# Patient Record
Sex: Female | Born: 1945 | Race: White | Hispanic: No | Smoking: Current every day smoker
Health system: Southern US, Community
[De-identification: ages and names within clinical notes are randomized; demographics above are authoritative.]

## PROBLEM LIST (undated history)

## (undated) DIAGNOSIS — I4891 Unspecified atrial fibrillation: Secondary | ICD-10-CM

## (undated) DIAGNOSIS — J449 Chronic obstructive pulmonary disease, unspecified: Secondary | ICD-10-CM

## (undated) DIAGNOSIS — I619 Nontraumatic intracerebral hemorrhage, unspecified: Secondary | ICD-10-CM

## (undated) DIAGNOSIS — S0990XA Unspecified injury of head, initial encounter: Secondary | ICD-10-CM

## (undated) DIAGNOSIS — I10 Essential (primary) hypertension: Secondary | ICD-10-CM

## (undated) DIAGNOSIS — E039 Hypothyroidism, unspecified: Secondary | ICD-10-CM

## (undated) HISTORY — PX: PACEMAKER INSERTION: SHX728

## (undated) HISTORY — PX: BRAIN SURGERY: SHX531

---

## 2012-05-19 ENCOUNTER — Ambulatory Visit: Payer: Self-pay | Admitting: Family Medicine

## 2012-06-19 ENCOUNTER — Ambulatory Visit: Payer: Self-pay | Admitting: Gastroenterology

## 2014-06-15 ENCOUNTER — Ambulatory Visit: Admit: 2014-06-15 | Disposition: A | Discharge status: Home / Self Care | Payer: Self-pay | Admitting: Cardiology

## 2014-06-15 DIAGNOSIS — R55 Syncope and collapse: Secondary | ICD-10-CM

## 2014-06-15 LAB — BASIC METABOLIC PANEL
Anion Gap: 5 — ABNORMAL LOW (ref 7–16)
BUN: 13 mg/dL
CALCIUM: 8.4 mg/dL — AB
CO2: 30 mmol/L
Chloride: 102 mmol/L
Creatinine: 0.75 mg/dL
EGFR (African American): >60
EGFR (Non-African Amer.): >60
GLUCOSE: 83 mg/dL
POTASSIUM: 4.1 mmol/L
Sodium: 137 mmol/L

## 2014-06-15 LAB — APTT: Activated PTT: 37.5 secs — ABNORMAL HIGH (ref 23.6–35.9)

## 2014-06-15 LAB — CBC WITH DIFFERENTIAL/PLATELET
Basophil #: 0.1 10*3/uL (ref 0.0–0.1)
Basophil %: 1.2 %
EOS ABS: 0.2 10*3/uL (ref 0.0–0.7)
Eosinophil %: 2 %
HCT: 38.7 % (ref 35.0–47.0)
HGB: 12.9 g/dL (ref 12.0–16.0)
LYMPHS PCT: 13.8 %
Lymphocyte #: 1.1 10*3/uL (ref 1.0–3.6)
MCH: 30.8 pg (ref 26.0–34.0)
MCHC: 33.4 g/dL (ref 32.0–36.0)
MCV: 92 fL (ref 80–100)
MONO ABS: 0.7 x10 3/mm (ref 0.2–0.9)
Monocyte %: 8.9 %
NEUTROS ABS: 5.8 10*3/uL (ref 1.4–6.5)
Neutrophil %: 74.1 %
Platelet: 241 10*3/uL (ref 150–440)
RBC: 4.2 10*6/uL (ref 3.80–5.20)
RDW: 14.7 % — ABNORMAL HIGH (ref 11.5–14.5)
WBC: 7.9 10*3/uL (ref 3.6–11.0)

## 2014-06-15 LAB — PROTIME-INR
INR: 1.9
Prothrombin Time: 21.9 secs — ABNORMAL HIGH

## 2014-06-15 LAB — DIGOXIN LEVEL: DIGOXIN: 0.8 ng/mL

## 2014-06-17 ENCOUNTER — Ambulatory Visit
Admit: 2014-06-17 | Disposition: A | Discharge status: Home / Self Care | Payer: Self-pay | Attending: Cardiology | Admitting: Cardiology

## 2014-06-17 LAB — PROTIME-INR
INR: 1.2
PROTHROMBIN TIME: 15.2 s — AB

## 2014-06-27 NOTE — Op Note (Signed)
PATIENT NAME:  Alicia Reynolds, Alicia Reynolds MR#:  [id number] DATE OF BIRTH:  May 21, 1945  DATE OF PROCEDURE:  06/17/2014  PRIMARY CARE PHYSICIAN:  Rhona LeavensJames F. Burnett ShengHedrick, M.D.   PROCEDURE:  Biventricular implantable cardiac defibrillator change out.   INDICATION: The patient is a 69 year old female with history of coronary artery disease, chronic systolic congestive heart failure who is status post BIVICD in 2009. Recent interrogation has shown the BIVICD was at elective replacement indication. The risks, benefits, and alternatives of BIVICD change out were explained to the patient and informed written consent was obtained.   DESCRIPTION OF PROCEDURE: She was brought to the operating room in a fasting state. The left pectoral region was prepped and draped in the usual sterile manner. Anesthesia was obtained with 1% Xylocaine locally. A 6 cm incision was performed over the old ICD site. The old BIVICD was achieved by electrocautery and blunt dissection. The leads were disconnected and connected to a new BIVICD (Medtronic [id number]).  The pocket was irrigated with gentamicin solution. The new ICD generator was positioned in the pocket. The pocket was closed with 2-0 and 4-0 Vicryl, respectively. Steri-Strips and pressure dressing were applied.   ____________________________ Marcina MillardAlexander Jahon Bart, MD ap:sp D: 06/17/2014 13:54:45 ET T: 06/17/2014 14:59:36 ET JOB#: [id number]  cc: Marcina MillardAlexander Tupac Jeffus, MD, <Dictator> Marcina MillardALEXANDER Shloime Keilman MD ELECTRONICALLY SIGNED 06/22/2014 17:19

## 2014-10-07 ENCOUNTER — Encounter: Payer: Self-pay | Admitting: Cardiology

## 2016-06-28 ENCOUNTER — Telehealth: Payer: Self-pay | Admitting: Family

## 2016-06-28 ENCOUNTER — Ambulatory Visit: Payer: Medicare PPO | Admitting: Family

## 2016-06-28 NOTE — Telephone Encounter (Signed)
Patient missed [facility] initial [facility] on [facility]/16. Will attempt to reschedule.

## 2017-01-10 ENCOUNTER — Other Ambulatory Visit: Payer: Self-pay | Admitting: Specialist

## 2017-01-10 DIAGNOSIS — R911 Solitary pulmonary nodule: Secondary | ICD-10-CM

## 2017-04-11 ENCOUNTER — Emergency Department
Admission: EM | Admit: 2017-04-11 | Discharge: 2017-04-11 | Disposition: A | Discharge status: Other Acute Inpatient Hospital | Payer: Medicare PPO | Attending: Emergency Medicine | Admitting: Emergency Medicine

## 2017-04-11 ENCOUNTER — Emergency Department: Payer: Medicare PPO

## 2017-04-11 DIAGNOSIS — Z95810 Presence of automatic (implantable) cardiac defibrillator: Secondary | ICD-10-CM | POA: Insufficient documentation

## 2017-04-11 DIAGNOSIS — S065X9A Traumatic subdural hemorrhage with loss of consciousness of unspecified duration, initial encounter: Secondary | ICD-10-CM

## 2017-04-11 DIAGNOSIS — I6200 Nontraumatic subdural hemorrhage, unspecified: Secondary | ICD-10-CM | POA: Insufficient documentation

## 2017-04-11 DIAGNOSIS — I5032 Chronic diastolic (congestive) heart failure: Secondary | ICD-10-CM | POA: Insufficient documentation

## 2017-04-11 DIAGNOSIS — I110 Hypertensive heart disease with heart failure: Secondary | ICD-10-CM | POA: Insufficient documentation

## 2017-04-11 DIAGNOSIS — J449 Chronic obstructive pulmonary disease, unspecified: Secondary | ICD-10-CM | POA: Insufficient documentation

## 2017-04-11 DIAGNOSIS — I4891 Unspecified atrial fibrillation: Secondary | ICD-10-CM | POA: Diagnosis not present

## 2017-04-11 DIAGNOSIS — F17210 Nicotine dependence, cigarettes, uncomplicated: Secondary | ICD-10-CM | POA: Insufficient documentation

## 2017-04-11 DIAGNOSIS — Z7901 Long term (current) use of anticoagulants: Secondary | ICD-10-CM | POA: Diagnosis not present

## 2017-04-11 DIAGNOSIS — I259 Chronic ischemic heart disease, unspecified: Secondary | ICD-10-CM | POA: Diagnosis not present

## 2017-04-11 DIAGNOSIS — R4182 Altered mental status, unspecified: Secondary | ICD-10-CM | POA: Diagnosis present

## 2017-04-11 DIAGNOSIS — S065XAA Traumatic subdural hemorrhage with loss of consciousness status unknown, initial encounter: Secondary | ICD-10-CM

## 2017-04-11 LAB — BASIC METABOLIC PANEL
Anion gap: 14 (ref 5–15)
BUN: 27 mg/dL — ABNORMAL HIGH (ref 6–20)
CHLORIDE: 97 mmol/L — AB (ref 101–111)
CO2: 24 mmol/L (ref 22–32)
Calcium: 9.4 mg/dL (ref 8.9–10.3)
Creatinine, Ser: 0.91 mg/dL (ref 0.44–1.00)
GFR calc Af Amer: >60 mL/min (ref >60)
GFR calc non Af Amer: >60 mL/min (ref >60)
GLUCOSE: 125 mg/dL — AB (ref 65–99)
Potassium: 4.4 mmol/L (ref 3.5–5.1)
Sodium: 135 mmol/L (ref 135–145)

## 2017-04-11 LAB — CBC
HEMATOCRIT: 39.6 % (ref 35.0–47.0)
HEMOGLOBIN: 13 g/dL (ref 12.0–16.0)
MCH: 29.3 pg (ref 26.0–34.0)
MCHC: 32.9 g/dL (ref 32.0–36.0)
MCV: 89.1 fL (ref 80.0–100.0)
Platelets: 222 10*3/uL (ref 150–440)
RBC: 4.45 MIL/uL (ref 3.80–5.20)
RDW: 16.4 % — ABNORMAL HIGH (ref 11.5–14.5)
WBC: 12.1 10*3/uL — ABNORMAL HIGH (ref 3.6–11.0)

## 2017-04-11 LAB — BLOOD GAS, ARTERIAL
ACID-BASE EXCESS: 3.8 mmol/L — AB (ref 0.0–2.0)
Bicarbonate: 25.4 mmol/L (ref 20.0–28.0)
FIO2: 1
O2 Saturation: 100 %
PH ART: 7.55 — AB (ref 7.350–7.450)
PO2 ART: 511 mmHg — AB (ref 83.0–108.0)
Patient temperature: 37
RATE: 16 resp/min
pCO2 arterial: 29 mmHg — ABNORMAL LOW (ref 32.0–48.0)

## 2017-04-11 LAB — GLUCOSE, CAPILLARY: Glucose-Capillary: 122 mg/dL — ABNORMAL HIGH (ref 65–99)

## 2017-04-11 LAB — PROTIME-INR
INR: 3.74
Prothrombin Time: 36.7 seconds — ABNORMAL HIGH (ref 11.4–15.2)

## 2017-04-11 LAB — APTT: aPTT: 35 seconds (ref 24–36)

## 2017-04-11 LAB — TROPONIN I

## 2017-04-11 MED ORDER — ROCURONIUM BROMIDE 50 MG/5ML IV SOLN
INTRAVENOUS | Status: AC | PRN
  Administered 2017-04-11: 50 mg via INTRAVENOUS

## 2017-04-11 MED ORDER — MANNITOL 25 % IV SOLN
1.0000 g/kg | Freq: Once | INTRAVENOUS | Status: AC
  Administered 2017-04-11: 47.6 g via INTRAVENOUS
  Filled 2017-04-11: qty 190.4

## 2017-04-11 MED ORDER — PROPOFOL 1000 MG/100ML IV EMUL
INTRAVENOUS | Status: AC
  Filled 2017-04-11: qty 100

## 2017-04-11 MED ORDER — ETOMIDATE 2 MG/ML IV SOLN
INTRAVENOUS | Status: AC | PRN
  Administered 2017-04-11: 15 mg via INTRAVENOUS

## 2017-04-11 MED ORDER — PROPOFOL 10 MG/ML IV BOLUS
40.0000 mg | Freq: Once | INTRAVENOUS | Status: AC
  Administered 2017-04-11: 40 mg via INTRAVENOUS

## 2017-04-11 MED ORDER — NICARDIPINE HCL IN NACL 20-0.86 MG/200ML-% IV SOLN
3.0000 mg/h | Freq: Once | INTRAVENOUS | Status: AC
  Administered 2017-04-11: 5 mg/h via INTRAVENOUS
  Filled 2017-04-11: qty 200

## 2017-04-11 MED ORDER — PROTHROMBIN COMPLEX CONC HUMAN 500 UNITS IV KIT
1000.0000 [IU] | PACK | Status: AC
  Administered 2017-04-11: 1000 [IU] via INTRAVENOUS
  Filled 2017-04-11: qty 1000

## 2017-04-11 MED ORDER — PROPOFOL 1000 MG/100ML IV EMUL
40.0000 ug/kg/min | Freq: Once | INTRAVENOUS | Status: AC
  Administered 2017-04-11: 40 ug/kg/min via INTRAVENOUS

## 2017-04-11 MED ORDER — NALOXONE HCL 2 MG/2ML IJ SOSY
PREFILLED_SYRINGE | INTRAMUSCULAR | Status: AC
  Administered 2017-04-11: 11:00:00
  Filled 2017-04-11: qty 2

## 2017-04-11 MED ORDER — VITAMIN K1 10 MG/ML IJ SOLN
10.0000 mg | INTRAVENOUS | Status: AC
  Administered 2017-04-11: 10 mg via INTRAVENOUS
  Filled 2017-04-11: qty 1

## 2017-04-11 NOTE — ED Notes (Signed)
og tube inserted 14 fr.  Port cxr is being done now.

## 2017-04-11 NOTE — Consult Note (Signed)
Call fielded for GCS7 with large L SDH  - transfer to [facility] - reverse anticoagulation - give mannitol - SBP<160

## 2017-04-11 NOTE — ED Notes (Signed)
Consent to transfer signed by patients boyfriend, pt intubated and unable to sign. Initial consent given by patients daughter over the phone to MD Don PerkingVeronese for treatment and transfer

## 2017-04-11 NOTE — ED Notes (Signed)
Hob is at 45 degrees.  ekg done.  Rhythm is unchanged with paced and occasional pvc.

## 2017-04-11 NOTE — ED Triage Notes (Signed)
Brought by ems for altered mental status stared yesterday during day, and boyfriend told ems he has been unable to wake her since 8 or 9pm last night.  Pt responds to tactile.  Pupils were non reactive and left is larger than right.  fsbs was 120s per ems.

## 2017-04-11 NOTE — ED Notes (Signed)
Patient is only responsive to painful stimuli.  Narcan given with no response.  Preparing to intubate.

## 2017-04-11 NOTE — ED Notes (Signed)
Spoke with daughter Jennette Kettlekim nalesnik on phone to give her patient condition.  Dr Don Perkingveronese spoke with her as well.  [place] and will be coming to [place] now.  Her cell number is [phone].

## 2017-04-11 NOTE — ED Provider Notes (Addendum)
[facility] Emergency Department Provider Note  ____________________________________________  Time seen: Approximately 11:42 AM  I have reviewed the triage vital signs and the nursing notes.   HISTORY  Chief Complaint Altered Mental Status  Level 5 caveat:  Portions of the history and physical were unable to be obtained due to unresponsive   HPI Alicia Reynolds is a 72 y.o. 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 According to EMS, patient's boyfriend was the one who called 911. According to him patient has been progressively more confused throughout the day yesterday. Since yesterday evening she has been unresponsive and unable to arouse sitting on her recliner. When EMS arrived at the house she had fixed and asymmetric pupils, patient was breathing, normal vitals, unresponsive. She received a milligram of Narcan with no change in mental status. BG 122.   PMH:   Hypertension 06/25/2016  MVP (mitral valve prolapse) 06/25/2016  Overview:   MV replacement with mechanical prosthesis 2006, on chronic warfarin   COPD (chronic obstructive pulmonary disease) , unspecified 06/25/2016  Myocardial infarction 06/25/2016  Overview:   Non STEMI, coronary stent 2008   SOB (shortness of breath) on exertion 12/28/2015  Chest pain with high risk for cardiac etiology, unspecified 05/31/2014  Status post myocardial infarction 10/07/2013  Overview:   [facility]    ICD (implantable cardioverter-defibrillator) in place 10/07/2013  Overview:   [facility]   Status post catheter ablation of atrial fibrillation 10/07/2013  Overview[facility]   Diastolic congestive heart failure 10/07/2013  Overview:   chronic   Atrial fibrillation  [427.31] 06/22/2013     Prior to Admission medications   Not on File    Allergies Patient has no allergy information on record.  FH Colon cancer Brother    Colon polyps Brother    Prostate cancer Brother    Heart disease Father    Myocardial Infarction (Heart attack) Father    Stomach cancer Maternal Aunt    Cancer Maternal Aunt    Breast cancer Mother    Osteoporosis (Thinning of bones) Mother       Social History Current Some Day Smoker Cigarettes 0.25 55   Smokeless Tobacco: Never Used      Tobacco Cessation: Ready to Quit: Yes; Counseling Given: Yes Comments: 2-3 cigarrettes per day   Alcohol Use Drinks/Week oz/Week Comments  Yes 0 Standard drinks or equivalent  0.0  socially     Review of Systems  Constitutional: + unresponsive  Level 5 caveat:  Portions of the history and physical were unable to be obtained due to unresponsive  ____________________________________________   PHYSICAL EXAM:  VITAL SIGNS: ED Triage Vitals  Enc Vitals Group     BP 04/11/17 1109 (!) 164/84     Pulse Rate 04/11/17 1105 81     Resp 04/11/17 1105 (!) 28     Temp --      Temp src --      SpO2 04/11/17 1105 96 %     Weight 04/11/17 1105 105 lb (47.6 kg)     Height --      Head Circumference --      Peak Flow --      Pain Score --      Pain Loc --  Pain Edu? --      Excl. in GC? --     Constitutional: GCS 7 on arrival, breathing, no signs of trauma HEENT:      Head: Normocephalic and atraumatic.         Eyes: Conjunctivae are normal. Sclera is non-icteric. R pupil is 3 mm and L 2 mm non reactive      Mouth/Throat: Mucous membranes are moist.       Neck: Supple with no signs of meningismus. Cardiovascular: Regular rate and rhythm. No murmurs, gallops, or rubs. 2+ symmetrical distal pulses are present in all extremities. No JVD. Respiratory: Normal respiratory effort. Lungs are clear to auscultation bilaterally. No wheezes, crackles, or  rhonchi.  Gastrointestinal: Soft, non distended with positive bowel sounds. No rebound or guarding. Musculoskeletal: No edema, cyanosis, or erythema of extremities. Neurologic: GCS 7 patient moves extremities to command, does not open eyes, non verbal Skin: Skin is warm, dry and intact. No rash noted.  ____________________________________________   LABS (all labs ordered are listed, but only abnormal results are displayed)  Labs Reviewed  GLUCOSE, CAPILLARY - Abnormal; Notable for the following components:      Result Value   Glucose-Capillary 122 (*)    All other components within normal limits  CBC - Abnormal; Notable for the following components:   WBC 12.1 (*)    RDW 16.4 (*)    All other components within normal limits  BASIC METABOLIC PANEL - Abnormal; Notable for the following components:   Chloride 97 (*)    Glucose, Bld 125 (*)    BUN 27 (*)    All other components within normal limits  PROTIME-INR - Abnormal; Notable for the following components:   Prothrombin Time 36.7 (*)    All other components within normal limits  BLOOD GAS, ARTERIAL - Abnormal; Notable for the following components:   pH, Arterial 7.55 (*)    pCO2 arterial 29 (*)    pO2, Arterial 511 (*)    Acid-Base Excess 3.8 (*)    All other components within normal limits  APTT  TROPONIN I   ____________________________________________  EKG  ED ECG REPORT I, Nita Sickle, the attending physician, personally viewed and interpreted this ECG.  Ventricular paced rhythm, rate of 67, normal QTC, normal axis, no ST elevations or depressions.  ____________________________________________  RADIOLOGY  Interpreted by me: CT head: Large subdural hematoma with midline shift   Interpretation by Radiologist:  Dg Chest Portable 1 View  Result Date: 04/11/2017 CLINICAL DATA:  Post intubation EXAM: PORTABLE CHEST 1 VIEW COMPARISON:  Portable exam 1130 hours compared to 06/15/2014 FINDINGS: Tip of  endotracheal tube projects 5.2 cm above carina. LEFT subclavian AICD leads project over RIGHT ventricle and coronary sinus. Normal heart size post MVR. Pulmonary vascular congestion. Atherosclerotic calcification aorta. Emphysematous and minimal bronchitic changes consistent with COPD. No acute infiltrate, pleural effusion, or pneumothorax. Bones diffusely demineralized. IMPRESSION: Satisfactory endotracheal tube position. COPD changes without definite acute infiltrate. Electronically Signed   By: Ulyses Southward M.D.   On: 04/11/2017 11:53   Ct Head Code Stroke Wo Contrast`  Result Date: 04/11/2017 CLINICAL DATA:  Code stroke.  Unresponsive.  Coumadin EXAM: CT HEAD WITHOUT CONTRAST TECHNIQUE: Contiguous axial images were obtained from the base of the skull through the vertex without intravenous contrast. COMPARISON:  None. FINDINGS: Brain: Large high-density acute subdural hematoma on the left centered in the frontal lobe and extending into the interhemispheric fissure. Small amount of tentorial subdural hemorrhage bilaterally. Left frontal  subdural hemorrhage measures up to 17 mm. Extensive subfalcine shift of 16 mm with obstruction of the right lateral ventricle which is mildly dilated. Negative for acute infarct or mass. Vascular: Negative for hyperdense vessel Skull: Negative for fracture Sinuses/Orbits: Negative Other: None IMPRESSION: 1. Acute large subdural hematoma on the left with extensive mass-effect. 16 mm midline shift. Mild dilatation and obstruction of the right lateral ventricle. 2. These results were called by telephone at the time of interpretation on 04/11/2017 at 11:47 am to Dr. Nita Sickle , who verbally acknowledged these results. Electronically Signed   By: Marlan Palau M.D.   On: 04/11/2017 11:48     ____________________________________________   PROCEDURES  Procedure(s) performed:yes Procedure Name: Intubation Date/Time: 04/11/2017 12:00 PM Performed by: Nita Sickle,  MD Pre-anesthesia Checklist: Patient identified, Emergency Drugs available, Suction available and Patient being monitored Preoxygenation: Pre-oxygenation with 100% oxygen Induction Type: IV induction and Rapid sequence Laryngoscope Size: Glidescope and 3 Tube size: 7.5 mm Number of attempts: 1 Airway Equipment and Method: Video-laryngoscopy Placement Confirmation: ETT inserted through vocal cords under direct vision,  CO2 detector and Breath sounds checked- equal and bilateral Secured at: 21 cm Tube secured with: ETT holder Dental Injury: Teeth and Oropharynx as per pre-operative assessment       Critical Care performed: yes  CRITICAL CARE Performed by: Nita Sickle  ?  Total critical care time: 60 min  Critical care time was exclusive of separately billable procedures and treating other patients.  Critical care was necessary to treat or prevent imminent or life-threatening deterioration.  Critical care was time spent personally by me on the following activities: development of treatment plan with patient and/or surrogate as well as nursing, discussions with consultants, evaluation of patient's response to treatment, examination of patient, obtaining history from patient or surrogate, ordering and performing treatments and interventions, ordering and review of laboratory studies, ordering and review of radiographic studies, pulse oximetry and re-evaluation of patient's condition.  ____________________________________________   INITIAL IMPRESSION / ASSESSMENT AND PLAN / ED COURSE  72 y.o. 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 Patient arrives in the emergency department with a GCS of 7, asymmetric and non reactive pupils. She was intubated with etomidate and rocuronium. CT scan showing a large subdural hematoma with midline shift. Patient is on Coumadin for mechanical valve. At this  time it is imperative to reverse her INR since this is a life-threatening injury and not doing so may lead to patient's death. I discussed with patient's Cardiologist Dr. Darrold Junker who understands the gravity of this injury. Spoke with patient's only daughter on the phone (she lives in [place]) and explained the risks and benefits of reversing her Coumadin in the setting of a mechanical valve. The daughter has given me consent to reverse the INR and do all measures needed at this time. Patient is intubated on propofol, Cardizem for blood pressure control, HOB elevated, she has received mannitol. Theodoro Parma will be initiated. I discussed with Dr. Marcell Barlow, [facility] who accepted patient as transfer. Patient will be received by Dr. Tanna Savoy at [facility] for immediate surgical management. Patient will be taken via helicopter to [facility].   Clinical Course as of Apr 11 1309  Thu Apr 11, 2017  1310 [facility] here to transport patient. Patient received Kcentra, IV K. Currently on mannitol, cardizem, and propofol.  [CV]    Clinical Course User Index [CV] Nita Sickle, MD  As part of my medical decision making, I reviewed the following data within the electronic MEDICAL RECORD NUMBER Nursing notes reviewed and incorporated, Labs reviewed , EKG interpreted , Radiograph reviewed , A consult was requested and obtained from this/these consultant(s) Neurosurgery, Notes from prior ED visits and [place] Controlled Substance Database    Pertinent labs & imaging results that were available during my care of the patient were reviewed by me and considered in my medical decision making (see chart for details).    ____________________________________________   FINAL CLINICAL IMPRESSION(S) / ED DIAGNOSES  Final diagnoses:  Subdural hematoma (HCC)      NEW MEDICATIONS STARTED DURING THIS VISIT:  ED Discharge Orders    None       Note:  This document was prepared using Dragon voice recognition software and may include  unintentional dictation errors.    Don Perking, [place], MD 04/11/17 1203    Don Perking [place], MD 04/11/17 1311

## 2017-04-11 NOTE — ED Notes (Signed)
EMTALA reviewed by this RN.  

## 2017-04-11 NOTE — ED Notes (Signed)
Pt's family at bedside. MD updated family on pt's status and plan of care

## 2017-04-11 NOTE — ED Notes (Signed)
In;tubation by dr Don Perkingveronese with glidescope.  ett 7.5 --21 at the lip.  Bagging patient.  RT to secure tube and attach to vent.

## 2017-04-11 NOTE — ED Notes (Signed)
Returned from ct scan 

## 2017-05-09 ENCOUNTER — Other Ambulatory Visit
Admission: RE | Admit: 2017-05-09 | Discharge: 2017-05-09 | Disposition: A | Discharge status: Home / Self Care | Payer: Medicare PPO | Source: Ambulatory Visit | Attending: Family Medicine | Admitting: Family Medicine

## 2017-05-09 DIAGNOSIS — I4891 Unspecified atrial fibrillation: Secondary | ICD-10-CM | POA: Diagnosis present

## 2017-05-09 LAB — PROTIME-INR
INR: 2.81
Prothrombin Time: 29.4 seconds — ABNORMAL HIGH (ref 11.4–15.2)

## 2017-06-13 ENCOUNTER — Encounter: Payer: Self-pay | Admitting: Emergency Medicine

## 2017-06-13 ENCOUNTER — Observation Stay
Admission: EM | Admit: 2017-06-13 | Discharge: 2017-06-15 | Disposition: A | Discharge status: Home / Self Care | Payer: Medicare PPO | Attending: Internal Medicine | Admitting: Internal Medicine

## 2017-06-13 ENCOUNTER — Emergency Department: Payer: Medicare PPO

## 2017-06-13 DIAGNOSIS — N390 Urinary tract infection, site not specified: Secondary | ICD-10-CM | POA: Diagnosis present

## 2017-06-13 DIAGNOSIS — Z880 Allergy status to penicillin: Secondary | ICD-10-CM | POA: Insufficient documentation

## 2017-06-13 DIAGNOSIS — I509 Heart failure, unspecified: Secondary | ICD-10-CM | POA: Insufficient documentation

## 2017-06-13 DIAGNOSIS — Z7901 Long term (current) use of anticoagulants: Secondary | ICD-10-CM | POA: Diagnosis not present

## 2017-06-13 DIAGNOSIS — I110 Hypertensive heart disease with heart failure: Secondary | ICD-10-CM | POA: Diagnosis not present

## 2017-06-13 DIAGNOSIS — K219 Gastro-esophageal reflux disease without esophagitis: Secondary | ICD-10-CM | POA: Diagnosis not present

## 2017-06-13 DIAGNOSIS — I10 Essential (primary) hypertension: Secondary | ICD-10-CM | POA: Diagnosis present

## 2017-06-13 DIAGNOSIS — Z950 Presence of cardiac pacemaker: Secondary | ICD-10-CM | POA: Insufficient documentation

## 2017-06-13 DIAGNOSIS — E039 Hypothyroidism, unspecified: Secondary | ICD-10-CM | POA: Diagnosis not present

## 2017-06-13 DIAGNOSIS — N3000 Acute cystitis without hematuria: Principal | ICD-10-CM | POA: Insufficient documentation

## 2017-06-13 DIAGNOSIS — I480 Paroxysmal atrial fibrillation: Secondary | ICD-10-CM | POA: Insufficient documentation

## 2017-06-13 DIAGNOSIS — N179 Acute kidney failure, unspecified: Secondary | ICD-10-CM | POA: Insufficient documentation

## 2017-06-13 DIAGNOSIS — J449 Chronic obstructive pulmonary disease, unspecified: Secondary | ICD-10-CM | POA: Insufficient documentation

## 2017-06-13 DIAGNOSIS — Z79899 Other long term (current) drug therapy: Secondary | ICD-10-CM | POA: Insufficient documentation

## 2017-06-13 DIAGNOSIS — E43 Unspecified severe protein-calorie malnutrition: Secondary | ICD-10-CM | POA: Diagnosis not present

## 2017-06-13 DIAGNOSIS — F17210 Nicotine dependence, cigarettes, uncomplicated: Secondary | ICD-10-CM | POA: Insufficient documentation

## 2017-06-13 DIAGNOSIS — G9340 Encephalopathy, unspecified: Secondary | ICD-10-CM | POA: Insufficient documentation

## 2017-06-13 DIAGNOSIS — Z885 Allergy status to narcotic agent status: Secondary | ICD-10-CM | POA: Diagnosis not present

## 2017-06-13 DIAGNOSIS — Z9981 Dependence on supplemental oxygen: Secondary | ICD-10-CM | POA: Diagnosis not present

## 2017-06-13 HISTORY — DX: Unspecified injury of head, initial encounter: S09.90XA

## 2017-06-13 HISTORY — DX: Unspecified atrial fibrillation: I48.91

## 2017-06-13 HISTORY — DX: Chronic obstructive pulmonary disease, unspecified: J44.9

## 2017-06-13 HISTORY — DX: Hypothyroidism, unspecified: E03.9

## 2017-06-13 HISTORY — DX: Nontraumatic intracerebral hemorrhage, unspecified: I61.9

## 2017-06-13 HISTORY — DX: Essential (primary) hypertension: I10

## 2017-06-13 LAB — LACTIC ACID, PLASMA: Lactic Acid, Venous: 1.2 mmol/L (ref 0.5–1.9)

## 2017-06-13 LAB — COMPREHENSIVE METABOLIC PANEL
ALT: 16 U/L (ref 14–54)
AST: 33 U/L (ref 15–41)
Albumin: 3.5 g/dL (ref 3.5–5.0)
Alkaline Phosphatase: 62 U/L (ref 38–126)
Anion gap: 12 (ref 5–15)
BILIRUBIN TOTAL: 0.8 mg/dL (ref 0.3–1.2)
BUN: 43 mg/dL — AB (ref 6–20)
CHLORIDE: 99 mmol/L — AB (ref 101–111)
CO2: 27 mmol/L (ref 22–32)
CREATININE: 1.51 mg/dL — AB (ref 0.44–1.00)
Calcium: 9.8 mg/dL (ref 8.9–10.3)
GFR calc Af Amer: 38 mL/min — ABNORMAL LOW (ref >60)
GFR calc non Af Amer: 33 mL/min — ABNORMAL LOW (ref >60)
GLUCOSE: 99 mg/dL (ref 65–99)
POTASSIUM: 3.8 mmol/L (ref 3.5–5.1)
Sodium: 138 mmol/L (ref 135–145)
Total Protein: 7.5 g/dL (ref 6.5–8.1)

## 2017-06-13 LAB — CBC WITH DIFFERENTIAL/PLATELET
BASOS ABS: 0.1 10*3/uL (ref 0–0.1)
BASOS PCT: 1 %
Eosinophils Absolute: 0.2 10*3/uL (ref 0–0.7)
Eosinophils Relative: 3 %
HEMATOCRIT: 39.3 % (ref 35.0–47.0)
Hemoglobin: 12.7 g/dL (ref 12.0–16.0)
LYMPHS PCT: 12 %
Lymphs Abs: 0.9 10*3/uL — ABNORMAL LOW (ref 1.0–3.6)
MCH: 28.3 pg (ref 26.0–34.0)
MCHC: 32.3 g/dL (ref 32.0–36.0)
MCV: 87.6 fL (ref 80.0–100.0)
MONO ABS: 1.1 10*3/uL — AB (ref 0.2–0.9)
Monocytes Relative: 15 %
NEUTROS ABS: 5 10*3/uL (ref 1.4–6.5)
Neutrophils Relative %: 69 %
PLATELETS: 203 10*3/uL (ref 150–440)
RBC: 4.48 MIL/uL (ref 3.80–5.20)
RDW: 18.6 % — AB (ref 11.5–14.5)
WBC: 7.2 10*3/uL (ref 3.6–11.0)

## 2017-06-13 LAB — URINALYSIS, COMPLETE (UACMP) WITH MICROSCOPIC
BILIRUBIN URINE: NEGATIVE
Glucose, UA: NEGATIVE mg/dL
Ketones, ur: NEGATIVE mg/dL
Nitrite: NEGATIVE
PROTEIN: NEGATIVE mg/dL
SQUAMOUS EPITHELIAL / LPF: NONE SEEN
Specific Gravity, Urine: 1.006 (ref 1.005–1.030)
pH: 5 (ref 5.0–8.0)

## 2017-06-13 LAB — TROPONIN I: Troponin I: 0.03 ng/mL (ref <0.03)

## 2017-06-13 MED ORDER — SODIUM CHLORIDE 0.9 % IV SOLN: 1.0000 g | Freq: Once | INTRAVENOUS | Status: DC

## 2017-06-13 MED ORDER — SODIUM CHLORIDE 0.9 % IV BOLUS
1000.0000 mL | Freq: Once | INTRAVENOUS | Status: AC
  Administered 2017-06-13: 1000 mL via INTRAVENOUS

## 2017-06-13 MED ORDER — MEROPENEM 1 G IV SOLR: 1.0000 g | Freq: Three times a day (TID) | INTRAVENOUS | Status: DC

## 2017-06-13 NOTE — ED Notes (Signed)
Dr Robinson at bedside 

## 2017-06-13 NOTE — ED Provider Notes (Signed)
[facility] Emergency Department Provider Note    First MD Initiated Contact with Patient 06/13/17 1639     (approximate)  I have reviewed the triage vital signs and the nursing notes.   HISTORY  Chief Complaint Altered Mental Status and Hypotension    HPI Alicia Reynolds is a 72 y.o. female presume admission [facility] for [facility] requiring neurosurgery having craniotomy presents to the ER with 2 days of confusion and decreased oral intake and fatigue. No lateralizing features. Patient's significant other is with her states that it was kind of a gradual onset of symptoms. No complaint of any pain but has had significantly decreased oral intake and weight loss. She is on chronic O2 for history of COPD and CHF. Denies any recent falls. No measured fevers. No nausea or vomiting. Frequent for the oxygen off while at home. Patient denies any pain or discomfort at this time.   Past Medical History:  Diagnosis Date  . Brain bleed (HCC)   . Head trauma    No family history on file.  There are no active problems to display for this patient.     Prior to Admission medications   Medication Sig Start Date End Date Taking? Authorizing Provider  bumetanide (BUMEX) 1 MG tablet Take 1 tablet by mouth every morning. 06/12/16  Yes [provider]  carvedilol (COREG) 12.5 MG tablet Take 1 tablet by mouth 2 (two) times daily. 10/26/16  Yes [provider]  ceFEPIme (MAXIPIME) 2 g injection Inject 2 g into the vein every 12 (twelve) hours. 05/23/17 06/24/17 Yes [provider]  digoxin (LANOXIN) 0.125 MG tablet Take 1 tablet by mouth every morning. 03/18/17  Yes [provider]  pantoprazole (PROTONIX) 40 MG tablet Take 1 tablet by mouth every morning. 05/24/17  Yes [provider]  potassium chloride SA (KLOR-CON M20) 20 MEQ tablet Take 1 tablet by mouth 2 (two) times daily. 06/12/16  Yes [provider]  tamsulosin (FLOMAX) 0.4 MG  CAPS capsule Take 1 capsule by mouth daily after breakfast. 05/24/17  Yes [provider]  vancomycin (VANCOCIN) 750 MG SOLR injection Inject 0.75 g into the vein daily. 05/24/17 06/24/17 Yes [provider]  vitamin B-12 (CYANOCOBALAMIN) 1000 MCG tablet Take 1 tablet by mouth daily.   Yes [provider]  warfarin (COUMADIN) 4 MG tablet Take 4MG  by mouth every Tuesday and Friday evening and take 2MG  by mouth all other evenings 03/11/17  Yes [provider]    Allergies Meperidine and related and Penicillins    Social History Social History   Tobacco Use  . Smoking status: Current Every Day Smoker  . Smokeless tobacco: Never Used  Substance Use Topics  . Alcohol use: Not Currently  . Drug use: Not Currently    Review of Systems Patient denies headaches, rhinorrhea, blurry vision, numbness, shortness of breath, chest pain, edema, cough, abdominal pain, nausea, vomiting, diarrhea, dysuria, fevers, rashes or hallucinations unless otherwise stated above in HPI. ____________________________________________   PHYSICAL EXAM:  VITAL SIGNS: Vitals:   06/13/17 2030 06/13/17 2130  BP: (!) 142/80   Pulse: 70 70  Resp: 17 17  Temp:    SpO2: 99% 99%    Constitutional: Alert and oriented. Ill and frail appearing in NAD on supplemental O2 Eyes: Conjunctivae are normal.  Head: s/p left crani, no purulent drainage from incision Nose: No congestion/rhinnorhea. Mouth/Throat: Mucous membranes are moist.   Neck: No stridor. Painless ROM.  Cardiovascular: Normal rate,  regular rhythm. Grossly normal heart sounds.  Good peripheral circulation. Respiratory: Normal respiratory effort.  No retractions. Lungs CTAB. Gastrointestinal: Soft and nontender. No distention. No abdominal bruits. No CVA tenderness. Genitourinary:  Musculoskeletal: No lower extremity tenderness nor edema.  No joint effusions. Neurologic:  Normal speech and language. No gross focal  neurologic deficits are appreciated. No facial droop Skin:  Skin is warm, dry and intact. No rash noted. Psychiatric: Mood and affect are normal. Speech and behavior are normal.  ____________________________________________   LABS (all labs ordered are listed, but only abnormal results are displayed)  Results for orders placed or performed during the hospital encounter of 06/13/17 (from the past 24 hour(s))  CBC with Differential/Platelet     Status: Abnormal   Collection Time: 06/13/17  4:46 PM  Result Value Ref Range   WBC 7.2 3.6 - 11.0 K/uL   RBC 4.48 3.80 - 5.20 MIL/uL   Hemoglobin 12.7 12.0 - 16.0 g/dL   HCT 40.9 81.1 - 91.4 %   MCV 87.6 80.0 - 100.0 fL   MCH 28.3 26.0 - 34.0 pg   MCHC 32.3 32.0 - 36.0 g/dL   RDW 78.2 (H) 95.6 - 21.3 %   Platelets 203 150 - 440 K/uL   Neutrophils Relative % 69 %   Neutro Abs 5.0 1.4 - 6.5 K/uL   Lymphocytes Relative 12 %   Lymphs Abs 0.9 (L) 1.0 - 3.6 K/uL   Monocytes Relative 15 %   Monocytes Absolute 1.1 (H) 0.2 - 0.9 K/uL   Eosinophils Relative 3 %   Eosinophils Absolute 0.2 0 - 0.7 K/uL   Basophils Relative 1 %   Basophils Absolute 0.1 0 - 0.1 K/uL  Comprehensive metabolic panel     Status: Abnormal   Collection Time: 06/13/17  4:46 PM  Result Value Ref Range   Sodium 138 135 - 145 mmol/L   Potassium 3.8 3.5 - 5.1 mmol/L   Chloride 99 (L) 101 - 111 mmol/L   CO2 27 22 - 32 mmol/L   Glucose, Bld 99 65 - 99 mg/dL   BUN 43 (H) 6 - 20 mg/dL   Creatinine, Ser 0.86 (H) 0.44 - 1.00 mg/dL   Calcium 9.8 8.9 - 57.8 mg/dL   Total Protein 7.5 6.5 - 8.1 g/dL   Albumin 3.5 3.5 - 5.0 g/dL   AST 33 15 - 41 U/L   ALT 16 14 - 54 U/L   Alkaline Phosphatase 62 38 - 126 U/L   Total Bilirubin 0.8 0.3 - 1.2 mg/dL   GFR calc non Af Amer 33 (L) >60 mL/min   GFR calc Af Amer 38 (L) >60 mL/min   Anion gap 12 5 - 15  Troponin I     Status: Abnormal   Collection Time: 06/13/17  4:46 PM  Result Value Ref Range   Troponin I 0.03 (HH) <0.03 ng/mL    Blood gas, venous     Status: Abnormal (Preliminary result)   Collection Time: 06/13/17  4:52 PM  Result Value Ref Range   FIO2 PENDING    Delivery systems PENDING    pH, Ven 7.42 7.250 - 7.430   pCO2, Ven 45 44.0 - 60.0 mmHg   pO2, Ven 32.0 32.0 - 45.0 mmHg   Bicarbonate 29.2 (H) 20.0 - 28.0 mmol/L   Acid-Base Excess 4.0 (H) 0.0 - 2.0 mmol/L   O2 Saturation 63.1 %   Patient temperature 37.0    Collection site VEIN    Sample type VEIN  Lactic acid, plasma     Status: None   Collection Time: 06/13/17  4:52 PM  Result Value Ref Range   Lactic Acid, Venous 1.2 0.5 - 1.9 mmol/L  Urinalysis, Complete w Microscopic     Status: Abnormal   Collection Time: 06/13/17  9:05 PM  Result Value Ref Range   Color, Urine STRAW (A) YELLOW   APPearance HAZY (A) CLEAR   Specific Gravity, Urine 1.006 1.005 - 1.030   pH 5.0 5.0 - 8.0   Glucose, UA NEGATIVE NEGATIVE mg/dL   Hgb urine dipstick SMALL (A) NEGATIVE   Bilirubin Urine NEGATIVE NEGATIVE   Ketones, ur NEGATIVE NEGATIVE mg/dL   Protein, ur NEGATIVE NEGATIVE mg/dL   Nitrite NEGATIVE NEGATIVE   Leukocytes, UA MODERATE (A) NEGATIVE   RBC / HPF 6-30 0 - 5 RBC/hpf   WBC, UA TOO NUMEROUS TO COUNT 0 - 5 WBC/hpf   Bacteria, UA RARE (A) NONE SEEN   Squamous Epithelial / LPF NONE SEEN NONE SEEN   Mucus PRESENT    ____________________________________________  EKG My review and personal interpretation at Time: 16:47   Indication: confusion  Rate: 70  Rhythm: v-paced Axis: left Other: nonspecific st abn with paced rhythm ____________________________________________  RADIOLOGY  I personally reviewed all radiographic images ordered to evaluate for the above acute complaints and reviewed radiology reports and findings.  These findings were personally discussed with the patient.  Please see medical record for radiology report.  ____________________________________________   PROCEDURES  Procedure(s) performed:  Procedures    Critical  Care performed: no ____________________________________________   INITIAL IMPRESSION / ASSESSMENT AND PLAN / ED COURSE  Pertinent labs & imaging results that were available during my care of the patient were reviewed by me and considered in my medical decision making (see chart for details).  DDX: Dehydration, sepsis, pna, uti, hypoglycemia, cva, drug effect, withdrawal, encephalitis   Alicia Reynolds is a 72 y.o. who presents to the ED with confusion as described above.  Blood work will be sent for the above differential.  Will order CT head to evaluate for evidence of bleed or stroke.  Abdominal exam soft and benign.  Do suspect some component of metabolic encephalopathy possible UTI.  Clinical Course as of Jun 14 2243  Thu Jun 13, 2017  2156 Patient does have evidence of UTI despite being on vancomycin as well as cefepime at home.  Certainly concerning for ESBL infection.  Will start meropenem.   [PR]    Clinical Course User Index [PR] Willy Eddy, MD     As part of my medical decision making, I reviewed the following data within the electronic MEDICAL RECORD NUMBER Nursing notes reviewed and incorporated, Labs reviewed, notes from prior ED visits and [place] Controlled Substance Database   ____________________________________________   FINAL CLINICAL IMPRESSION(S) / ED DIAGNOSES  Final diagnoses:  Acute encephalopathy  Acute cystitis without hematuria      NEW MEDICATIONS STARTED DURING THIS VISIT:  New Prescriptions   No medications on file     Note:  This document was prepared using Dragon voice recognition software and may include unintentional dictation errors.    Willy Eddy, MD 06/13/17 [phone]

## 2017-06-13 NOTE — ED Triage Notes (Signed)
Pt comes into the ED via POV with her significant other c/o altered mental status and hypotension.  Patient has recently undergone surgery for a head trauma that resulted in a brain bleed.  [facility] and finally has returned home.  Significant other states that she has been acting like herself, has been altered from baseline , hasn't been eating and drinking well.  Patient in NAD at this time with even and unlabored respirations, but is unable to answer many questions at this time.

## 2017-06-13 NOTE — ED Notes (Signed)
Pt arrives to ED with significant other. States that she hasn't been acting her self x 2 days. Will answer some questions but not all questions. States her birthday is [birth year]. Alert, moving all extremities. Noted sutures to top, L, and posterior head from recent surgery. SO states that pt had 3 head surgeries while at [facility]. States pt hasn't been eating or drinking much past few days. States pt wears 2 L nasal cannula at home but has been pulling it off. Arrives on room air at 85%, placed on 3 L nasal cannula, came up to 87%, increased to 4 L nasal cannula and maintaining mid90's. No resp distress noted. Pt is pleasant and calm. 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 R arm PICC in place, SO states she gets heparin and vanc at home through PICC. Unsure why pt receives vanc. States home health RN that comes to house told SO that pt's oxygen level and BP has been low at home today.

## 2017-06-13 NOTE — ED Notes (Signed)
Pt taken to CT.

## 2017-06-13 NOTE — ED Notes (Signed)
Pt lying on stretcher, side rails raised. Blanket covering her. Still wearing her oxygen. No distress noted. Pt informed of wait for IV antibiotics. Significant other has gone home.

## 2017-06-13 NOTE — ED Notes (Signed)
Pt ambulatory to toilet with this RN. Tolerated well.

## 2017-06-14 ENCOUNTER — Encounter: Payer: Self-pay | Admitting: Internal Medicine

## 2017-06-14 ENCOUNTER — Other Ambulatory Visit: Payer: Self-pay

## 2017-06-14 DIAGNOSIS — N179 Acute kidney failure, unspecified: Secondary | ICD-10-CM | POA: Diagnosis present

## 2017-06-14 DIAGNOSIS — E43 Unspecified severe protein-calorie malnutrition: Secondary | ICD-10-CM

## 2017-06-14 DIAGNOSIS — I480 Paroxysmal atrial fibrillation: Secondary | ICD-10-CM | POA: Diagnosis present

## 2017-06-14 DIAGNOSIS — G9340 Encephalopathy, unspecified: Secondary | ICD-10-CM | POA: Diagnosis present

## 2017-06-14 DIAGNOSIS — K219 Gastro-esophageal reflux disease without esophagitis: Secondary | ICD-10-CM | POA: Diagnosis present

## 2017-06-14 DIAGNOSIS — I10 Essential (primary) hypertension: Secondary | ICD-10-CM | POA: Diagnosis present

## 2017-06-14 DIAGNOSIS — N390 Urinary tract infection, site not specified: Secondary | ICD-10-CM | POA: Diagnosis present

## 2017-06-14 DIAGNOSIS — E039 Hypothyroidism, unspecified: Secondary | ICD-10-CM | POA: Diagnosis present

## 2017-06-14 LAB — CBC
HEMATOCRIT: 35.9 % (ref 35.0–47.0)
HEMOGLOBIN: 11.7 g/dL — AB (ref 12.0–16.0)
MCH: 28.5 pg (ref 26.0–34.0)
MCHC: 32.4 g/dL (ref 32.0–36.0)
MCV: 87.9 fL (ref 80.0–100.0)
Platelets: 213 10*3/uL (ref 150–440)
RBC: 4.09 MIL/uL (ref 3.80–5.20)
RDW: 18.6 % — ABNORMAL HIGH (ref 11.5–14.5)
WBC: 7.8 10*3/uL (ref 3.6–11.0)

## 2017-06-14 LAB — PROTIME-INR
INR: 2.7
Prothrombin Time: 28.5 seconds — ABNORMAL HIGH (ref 11.4–15.2)

## 2017-06-14 LAB — BASIC METABOLIC PANEL
Anion gap: 11 (ref 5–15)
BUN: 41 mg/dL — ABNORMAL HIGH (ref 6–20)
CHLORIDE: 104 mmol/L (ref 101–111)
CO2: 26 mmol/L (ref 22–32)
Calcium: 9.1 mg/dL (ref 8.9–10.3)
Creatinine, Ser: 1.35 mg/dL — ABNORMAL HIGH (ref 0.44–1.00)
GFR calc Af Amer: 44 mL/min — ABNORMAL LOW (ref >60)
GFR calc non Af Amer: 38 mL/min — ABNORMAL LOW (ref >60)
GLUCOSE: 72 mg/dL (ref 65–99)
POTASSIUM: 3.5 mmol/L (ref 3.5–5.1)
Sodium: 141 mmol/L (ref 135–145)

## 2017-06-14 LAB — DIGOXIN LEVEL: DIGOXIN LVL: 1.3 ng/mL (ref 0.8–2.0)

## 2017-06-14 LAB — VANCOMYCIN, TROUGH: Vancomycin Tr: 35 ug/mL (ref 15–20)

## 2017-06-14 MED ORDER — SODIUM CHLORIDE 0.9 % IV SOLN
INTRAVENOUS | Status: AC
  Administered 2017-06-14: 02:00:00 via INTRAVENOUS

## 2017-06-14 MED ORDER — CARVEDILOL 3.125 MG PO TABS
12.5000 mg | ORAL_TABLET | Freq: Two times a day (BID) | ORAL | Status: DC
  Administered 2017-06-15: 09:00:00 12.5 mg via ORAL
  Filled 2017-06-14 (×2): qty 4

## 2017-06-14 MED ORDER — DIGOXIN 125 MCG PO TABS
125.0000 ug | ORAL_TABLET | ORAL | Status: DC
  Administered 2017-06-14 – 2017-06-15 (×2): 125 ug via ORAL
  Filled 2017-06-14 (×2): qty 1

## 2017-06-14 MED ORDER — ACETAMINOPHEN 650 MG RE SUPP: 650.0000 mg | Freq: Four times a day (QID) | RECTAL | Status: DC | PRN

## 2017-06-14 MED ORDER — WARFARIN SODIUM 2 MG PO TABS
2.0000 mg | ORAL_TABLET | ORAL | Status: DC
  Filled 2017-06-14: qty 1

## 2017-06-14 MED ORDER — WARFARIN SODIUM 4 MG PO TABS
4.0000 mg | ORAL_TABLET | ORAL | Status: DC
  Administered 2017-06-14: 4 mg via ORAL
  Filled 2017-06-14: qty 1

## 2017-06-14 MED ORDER — ONDANSETRON HCL 4 MG/2ML IJ SOLN: 4.0000 mg | Freq: Four times a day (QID) | INTRAMUSCULAR | Status: DC | PRN

## 2017-06-14 MED ORDER — SODIUM CHLORIDE 0.9 % IV SOLN
500.0000 mg | Freq: Two times a day (BID) | INTRAVENOUS | Status: DC
  Administered 2017-06-14 – 2017-06-15 (×4): 500 mg via INTRAVENOUS
  Filled 2017-06-14 (×7): qty 0.5

## 2017-06-14 MED ORDER — VANCOMYCIN HCL IN DEXTROSE 750-5 MG/150ML-% IV SOLN
750.0000 mg | INTRAVENOUS | Status: DC | PRN
  Filled 2017-06-14: qty 150

## 2017-06-14 MED ORDER — VANCOMYCIN HCL IN DEXTROSE 750-5 MG/150ML-% IV SOLN
750.0000 mg | INTRAVENOUS | Status: DC
  Filled 2017-06-14: qty 150

## 2017-06-14 MED ORDER — ADULT MULTIVITAMIN W/MINERALS CH
1.0000 | ORAL_TABLET | Freq: Every day | ORAL | Status: DC
  Administered 2017-06-14 – 2017-06-15 (×2): 1 via ORAL
  Filled 2017-06-14 (×2): qty 1

## 2017-06-14 MED ORDER — TAMSULOSIN HCL 0.4 MG PO CAPS
0.4000 mg | ORAL_CAPSULE | Freq: Every day | ORAL | Status: DC
  Administered 2017-06-14 – 2017-06-15 (×2): 0.4 mg via ORAL
  Filled 2017-06-14 (×2): qty 1

## 2017-06-14 MED ORDER — ENSURE ENLIVE PO LIQD
237.0000 mL | Freq: Two times a day (BID) | ORAL | Status: DC
  Administered 2017-06-14: 237 mL via ORAL

## 2017-06-14 MED ORDER — ORAL CARE MOUTH RINSE
15.0000 mL | Freq: Two times a day (BID) | OROMUCOSAL | Status: DC
  Administered 2017-06-14 (×2): 15 mL via OROMUCOSAL

## 2017-06-14 MED ORDER — VANCOMYCIN HCL 750 MG IV SOLR: 750.0000 mg | Freq: Every day | INTRAVENOUS | Status: DC

## 2017-06-14 MED ORDER — ONDANSETRON HCL 4 MG PO TABS: 4.0000 mg | ORAL_TABLET | Freq: Four times a day (QID) | ORAL | Status: DC | PRN

## 2017-06-14 MED ORDER — PANTOPRAZOLE SODIUM 40 MG PO TBEC
40.0000 mg | DELAYED_RELEASE_TABLET | ORAL | Status: DC
  Administered 2017-06-14 – 2017-06-15 (×2): 40 mg via ORAL
  Filled 2017-06-14 (×2): qty 1

## 2017-06-14 MED ORDER — ACETAMINOPHEN 325 MG PO TABS: 650.0000 mg | ORAL_TABLET | Freq: Four times a day (QID) | ORAL | Status: DC | PRN

## 2017-06-14 NOTE — H&P (Signed)
[facility] [place] [facility]   PATIENT NAME: Alicia Reynolds    MR#:  [id number]  DATE OF BIRTH:  [birth year]  DATE OF ADMISSION:  06/13/2017  PRIMARY CARE PHYSICIAN: Jerl Mina, MD   REQUESTING/REFERRING PHYSICIAN: Roxan Hockey, MD  CHIEF COMPLAINT:   Chief Complaint  Patient presents with  . Altered Mental Status  . Hypotension    HISTORY OF PRESENT ILLNESS:  Alicia Reynolds  is a 72 y.o. female who presents with her mental status.  Patient is unable to contribute to her HPI due to the same.  Here in the ED she was found on evaluation to have UTI, as well as some acute kidney injury.  Hospitalist were called for admission  PAST MEDICAL HISTORY:   Past Medical History:  Diagnosis Date  . Atrial fibrillation (HCC)   . Brain bleed (HCC)   . COPD (chronic obstructive pulmonary disease) (HCC)   . Head trauma   . Hypertension   . Hypothyroidism      PAST SURGICAL HISTORY:   Past Surgical History:  Procedure Laterality Date  . BRAIN SURGERY    . PACEMAKER INSERTION       SOCIAL HISTORY:   Social History   Tobacco Use  . Smoking status: Current Every Day Smoker  . Smokeless tobacco: Never Used  Substance Use Topics  . Alcohol use: Not Currently     FAMILY HISTORY:   Family History  Problem Relation Age of Onset  . Breast cancer Mother   . Heart attack Father      DRUG ALLERGIES:   Allergies  Allergen Reactions  . Meperidine And Related   . Penicillins     MEDICATIONS AT HOME:   Prior to Admission medications   Medication Sig Start Date End Date Taking? Authorizing Provider  bumetanide (BUMEX) 1 MG tablet Take 1 tablet by mouth every morning. 06/12/16  Yes [provider]  carvedilol (COREG) 12.5 MG tablet Take 1 tablet by mouth 2 (two) times daily. 10/26/16  Yes [provider]  ceFEPIme (MAXIPIME) 2 g injection Inject 2 g into the vein every 12 (twelve) hours. 05/23/17 06/24/17 Yes [provider]   digoxin (LANOXIN) 0.125 MG tablet Take 1 tablet by mouth every morning. 03/18/17  Yes [provider]  pantoprazole (PROTONIX) 40 MG tablet Take 1 tablet by mouth every morning. 05/24/17  Yes [provider]  potassium chloride SA (KLOR-CON M20) 20 MEQ tablet Take 1 tablet by mouth 2 (two) times daily. 06/12/16  Yes [provider]  tamsulosin (FLOMAX) 0.4 MG CAPS capsule Take 1 capsule by mouth daily after breakfast. 05/24/17  Yes [provider]  vancomycin (VANCOCIN) 750 MG SOLR injection Inject 0.75 g into the vein daily. 05/24/17 06/24/17 Yes [provider]  vitamin B-12 (CYANOCOBALAMIN) 1000 MCG tablet Take 1 tablet by mouth daily.   Yes [provider]  warfarin (COUMADIN) 4 MG tablet Take 4MG  by mouth every Tuesday and Friday evening and take 2MG  by mouth all other evenings 03/11/17  Yes [provider]    REVIEW OF SYSTEMS:  Review of Systems  Unable to perform ROS: Acuity of condition     VITAL SIGNS:   Vitals:   06/13/17 2006 06/13/17 2030 06/13/17 2130 06/14/17 0000  BP: 136/70 (!) 142/80  127/71  Pulse: 70 70 70   Resp: 18 17 17 17   Temp:      TempSrc:      SpO2: 98% 99% 99%  Weight:      Height:       Wt Readings from Last 3 Encounters:  06/13/17 40.8 kg (90 lb)  04/11/17 47.6 kg (105 lb)    PHYSICAL EXAMINATION:  Physical Exam  Vitals reviewed. Constitutional: She is oriented to person, place, and time. She appears well-developed and well-nourished. No distress.  HENT:  Head: Normocephalic and atraumatic.  Mouth/Throat: Oropharynx is clear and moist.  Eyes: Pupils are equal, round, and reactive to light. Conjunctivae and EOM are normal. No scleral icterus.  Neck: Normal range of motion. Neck supple. No JVD present. No thyromegaly present.  Cardiovascular: Normal rate, regular rhythm and intact distal pulses. Exam reveals no gallop and no friction rub.  No murmur heard. Respiratory: Effort normal and  breath sounds normal. No respiratory distress. She has no wheezes. She has no rales.  GI: Soft. Bowel sounds are normal. She exhibits no distension. There is no tenderness.  Musculoskeletal: Normal range of motion. She exhibits no edema.  No arthritis, no gout  Lymphadenopathy:    She has no cervical adenopathy.  Neurological: She is alert and oriented to person, place, and time. No cranial nerve deficit.  No dysarthria, no aphasia  Skin: Skin is warm and dry. No rash noted. No erythema.  Psychiatric: She has a normal mood and affect. Her behavior is normal. Judgment and thought content normal.    LABORATORY PANEL:   CBC Recent Labs  Lab 06/13/17 1646  WBC 7.2  HGB 12.7  HCT 39.3  PLT 203   ------------------------------------------------------------------------------------------------------------------  Chemistries  Recent Labs  Lab 06/13/17 1646  NA 138  K 3.8  CL 99*  CO2 27  GLUCOSE 99  BUN 43*  CREATININE 1.51*  CALCIUM 9.8  AST 33  ALT 16  ALKPHOS 62  BILITOT 0.8   ------------------------------------------------------------------------------------------------------------------  Cardiac Enzymes Recent Labs  Lab 06/13/17 1646  TROPONINI 0.03*   ------------------------------------------------------------------------------------------------------------------  RADIOLOGY:  Dg Chest 2 View  Result Date: 06/13/2017 CLINICAL DATA:  Altered mental status, hypotension, recent head trauma and intracranial hemorrhage EXAM: CHEST - 2 VIEW COMPARISON:  04/11/2017 FINDINGS: LEFT subclavian AICD leads project at RIGHT atrium, RIGHT ventricle and coronary sinus. RIGHT arm PICC line tip projects over SVC. Enlargement of cardiac silhouette post MVR. Mild pulmonary vascular congestion. Atherosclerotic calcification aorta. Emphysematous and bronchitic changes consistent with COPD. No acute infiltrate, pleural effusion or pneumothorax. Bones appear diffusely demineralized.  IMPRESSION: Enlargement of cardiac silhouette with pulmonary vascular congestion post AICD and MVR. COPD changes without acute infiltrate. Electronically Signed   By: Ulyses Southward M.D.   On: 06/13/2017 17:23   Ct Head Wo Contrast  Result Date: 06/13/2017 CLINICAL DATA:  Altered mental status and hypotension. History of recent surgery for head trauma resulting in intracranial hemorrhage. EXAM: CT HEAD WITHOUT CONTRAST TECHNIQUE: Contiguous axial images were obtained from the base of the skull through the vertex without intravenous contrast. COMPARISON:  Head CT dated 04/11/2017. FINDINGS: Brain: Mild generalized age related parenchymal atrophy with commensurate dilatation of the ventricles and sulci. Persistent mild sulcal effacement throughout the LEFT frontoparietal lobe, but no hemorrhage, edema or other evidence of acute parenchymal abnormality. Thin residual extra-axial hemorrhage underlying the LEFT temporoparietal craniotomy, measuring 1-2 mm thickness, without appreciable mass effect. The previously demonstrated large extra-axial hemorrhage is otherwise resolved. No residual midline shift. Vascular: There are chronic calcified atherosclerotic changes of the large vessels at the skull base. No unexpected hyperdense vessel. Skull: Expected surgical changes of LEFT frontotemporoparietal craniotomy. No evidence of  surgical complicating feature. No acute appearing osseous abnormality Sinuses/Orbits: No acute finding. Other: None. IMPRESSION: 1. No acute findings. No parenchymal hemorrhage or edema. No evidence of post surgical complicating feature. 2. Thin residual extra-axial hemorrhage underlying the LEFT temporoparietal component of the craniotomy, measuring 1-2 mm thickness, without mass effect. The previously demonstrated large LEFT-sided extra-axial hemorrhage is otherwise resolved. No midline shift. Electronically Signed   By: Bary RichardStan  Maynard M.D.   On: 06/13/2017 17:48    EKG:   Orders placed or  performed during the hospital encounter of 06/13/17  . ED EKG  . ED EKG  . EKG 12-Lead  . EKG 12-Lead    IMPRESSION AND PLAN:  Principal Problem:   UTI (urinary tract infection) -IV antibiotics started, urine culture sent Active Problems:   Acute encephalopathy -secondary to her UTI, monitor for improvement as her infection clears   AKI (acute kidney injury) (HCC) -due to UTI and dehydration, IV fluids tonight, avoid nephrotoxins and monitor   HTN (hypertension) -stable, continue home meds   AF (paroxysmal atrial fibrillation) (HCC) -continue home dose rate controlling medications as well as anticoagulation   GERD (gastroesophageal reflux disease) -Home dose PPI  Chart review performed and case discussed with ED provider. Labs, imaging and/or ECG reviewed by provider and discussed with patient/family. Management plans discussed with the patient and/or family.  DVT PROPHYLAXIS: Systemic anticoagulation  GI PROPHYLAXIS: PPI  ADMISSION STATUS: Observation  CODE STATUS: Full  TOTAL TIME TAKING CARE OF THIS PATIENT: 45 minutes.   Roseline Ebarb FIELDING 06/14/2017, 12:55 AM  [facility]  Office  [phone]  CC: Primary care physician; Jerl MinaHedrick, James, MD  Note:  This document was prepared using Dragon voice recognition software and may include unintentional dictation errors.

## 2017-06-14 NOTE — Progress Notes (Signed)
Patient seen and evaluated by me today  patient admitted this morning by Dr. Oralia Manisavid Willis I agree with history and physical exam by Dr. Oralia Manisavid Willis  continue IV antibiotics for UTI Encephalopathy improved Agree with treatment plan  by Dr. Anne HahnWillis.

## 2017-06-14 NOTE — ED Notes (Signed)
Patient transported to 105 

## 2017-06-14 NOTE — Progress Notes (Signed)
Pharmacy Antibiotic Note  Marlowe ShoresCarol G Semmel is a 72 y.o. female admitted on 06/13/2017 with UTI.  Pharmacy has been consulted for meropenem dosing.  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 Last day of treatment should be 4/29.   Cefepime has been switched to meropenem while inpatient. Pharmacy consulted for Vancomycin monitoring and dosing. On admission patient has AKI and Vanc trough of 35.   Plan: Will hold vancomycin and dose by levels. Will order vancomycin random level 24 hours after supratherapeutic trough. Plan to order Vancomycin 750mg  when Vanc level less then 20.   Continue Meropenem 500 mg q 12 hours ordered.  Height: 5\' 5"  (165.1 cm) Weight: 78 lb 9.6 oz (35.7 kg) IBW/kg (Calculated) : 57  Temp (24hrs), Avg:98 F (36.7 C), Min:97.7 F (36.5 C), Max:98.4 F (36.9 C)  Recent Labs  Lab 06/13/17 1646 06/13/17 1652 06/14/17 0509 06/14/17 0845  WBC 7.2  --  7.8  --   CREATININE 1.51*  --  1.35*  --   LATICACIDVEN  --  1.2  --   --   VANCOTROUGH  --   --   --  35*    Estimated Creatinine Clearance: 20.6 mL/min (A) (by C-G formula based on SCr of 1.35 mg/dL (H)).    Allergies  Allergen Reactions  . Meperidine And Related   . Penicillins     Antimicrobials this admission: Meropenem 4/19  >>  Vancomycin  >>   Dose adjustments this admission:  Microbiology results: 4/18 UCx: pending   4/18 UA: LE(+) NO2(-)  WBC TNTC Thank you for allowing pharmacy to be a part of this patient's care.  Gardner CandleSheema M Devario Bucklew, PharmD, BCPS Clinical Pharmacist 06/14/2017 3:08 PM

## 2017-06-14 NOTE — Progress Notes (Addendum)
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  GOAL:   Patient will meet greater than or equal to 90% of their needs  MONITOR:   PO intake, Supplement acceptance, Labs, I & O's, Weight trends  REASON FOR ASSESSMENT:   Malnutrition Screening Tool   ASSESSMENT:  72 y.o. female with hx of TBI, COPD and CHF. Presents to ED with altered mental status. In the ED she was found on evaluation to have UTI, as well as some acute kidney injury. No complaint of any pain but has had significantly decreased oral intake and weight loss.   Met with pt at bedside today. Pt reports fair appetite and po intake pta. However, RD suspects poor po intake pta. Per chart, pt consumed 100% of breakfast. Pt has wt. loss of 9 lbs (9%) in 1 month- this is significant weight loss per time frame. Encouraged pt to continue eating and offered supplements to help pt meet calorie and protein needs. Pt is willing to try ensure and orange magic cup. Pt prefers room temperature ensure. Pt reports having trouble keeping weight on her entire life.  Pt reports taking vitamin B12 and a MVI daily at home. Recommend MVI daily during hospital stay.  Pt complained about the cuts of meat on her trays. Offered pt chopped or ground food, but pt refused. Pt denies any problems chewing or swallowing. Noted pt reported having new dentures.  Pt denied any GI issues.    D/t pt's suspected poor po intake and significant wt loss, pt is at high refeed  risk. Recommend monitor phos and mag labs.  Medications reviewed and include: carvedilol, lanoxin, pantoprazole, warfarin, meropenem   Labs reviewed: BUN 41(H), Creatinine 1.35(H), K 3.5 wnl, GFR 38(L), RDW 18.6(H), Prothrombin time 28.5(H)  NUTRITION - FOCUSED PHYSICAL EXAM:    Most Recent Value  Orbital Region  Moderate depletion  Upper Arm Region  Severe depletion  Thoracic and Lumbar Region  Severe depletion  Buccal Region  Moderate depletion  Temple Region  Severe depletion  Clavicle Bone Region  Severe depletion  Clavicle and Acromion Bone Region  Severe depletion  Scapular Bone Region  Severe depletion  Dorsal Hand  Severe depletion  Patellar Region  Severe depletion  Anterior Thigh Region  Severe depletion  Posterior Calf Region  Severe depletion  Edema (RD Assessment)  None  Hair  Reviewed  Eyes  Reviewed  Mouth  Reviewed  Skin  Reviewed  Nails  Reviewed     Diet Order:  Diet Heart Room service appropriate? Yes; Fluid consistency: Thin  EDUCATION NEEDS:   No education needs have been identified at this time  Skin:  Skin Assessment: Skin Integrity Issues: Skin Integrity Issues:: Incisions Incisions: right posterior medial upper head  Last BM:  06/12/17(per nurse)  Height:   Ht Readings from Last 1 Encounters:  06/14/17 '5\' 5"'  (1.651 m)   Weight:   Wt Readings from Last 1 Encounters:  06/14/17 87 lb 9.6 oz (39.7 kg)   Ideal Body Weight:  56.8 kg  BMI:  Body mass index is 14.58 kg/m.  Estimated Nutritional Needs:   Kcal:  1200-1400 kcals (MSJ ABW x 1.3-1.5)  Protein:  60-68 g/day (ABW x 1.5-1.7)  Fluid:  <1.2 L/day  Alfonse Ras, [place] Dietetic Intern [phone]

## 2017-06-14 NOTE — Care Management Obs Status (Signed)
MEDICARE OBSERVATION STATUS NOTIFICATION   Patient Details  Name: Alicia ShoresCarol G Fullbright MRN: [id number] Date of Birth: 06-Mar-1945   Medicare Observation Status Notification Given:  Yes: Permission to x given per Ms. Sela HuaFunk    Ellise Kovack S Davis Ambrosini, RN 06/14/2017, 8:37 AM

## 2017-06-14 NOTE — Progress Notes (Signed)
ANTICOAGULATION CONSULT NOTE - Initial Consult  Pharmacy Consult for warfarin dosing Indication: atrial fibrillation  Allergies  Allergen Reactions  . Meperidine And Related   . Penicillins     Patient Measurements: Height: 5\' 5"  (165.1 cm) Weight: 78 lb 9.6 oz (35.7 kg) IBW/kg (Calculated) : 57 Heparin Dosing Weight: n/a  Vital Signs: Temp: 98.4 F (36.9 C) (04/19 0515) Temp Source: Oral (04/19 0515) BP: 115/52 (04/19 0515) Pulse Rate: 69 (04/19 0515)  Labs: Recent Labs    06/13/17 1646 06/14/17 0509  HGB 12.7 11.7*  HCT 39.3 35.9  PLT 203 213  LABPROT  --  28.5*  INR  --  2.70  CREATININE 1.51* 1.35*  TROPONINI 0.03*  --     Estimated Creatinine Clearance: 20.6 mL/min (A) (by C-G formula based on SCr of 1.35 mg/dL (H)).   Medical History: Past Medical History:  Diagnosis Date  . Atrial fibrillation (HCC)   . Brain bleed (HCC)   . COPD (chronic obstructive pulmonary disease) (HCC)   . Head trauma   . Hypertension   . Hypothyroidism     Medications:  Home dose 4 mg Tuesday and Friday, 2 mg all other days  Assessment: INR therapeutic on admission  Goal of Therapy:  INR 2-3    Plan:  Resume home regimen as above. INR daily while on antibiotics.  Alicia Reynolds S 06/14/2017,5:56 AM

## 2017-06-14 NOTE — Progress Notes (Signed)
Pharmacy Antibiotic Note  Alicia ShoresCarol G Reynolds is a 72 y.o. female admitted on 06/13/2017 with UTI.  Pharmacy has been consulted for meropenem dosing.  Plan: Meropenem 500 mg q 12 hours ordered.  Height: 5\' 4"  (162.6 cm) Weight: 90 lb (40.8 kg) IBW/kg (Calculated) : 54.7  Temp (24hrs), Avg:97.7 F (36.5 C), Min:97.7 F (36.5 C), Max:97.7 F (36.5 C)  Recent Labs  Lab 06/13/17 1646 06/13/17 1652  WBC 7.2  --   CREATININE 1.51*  --   LATICACIDVEN  --  1.2    Estimated Creatinine Clearance: 21.1 mL/min (A) (by C-G formula based on SCr of 1.51 mg/dL (H)).    Allergies  Allergen Reactions  . Meperidine And Related   . Penicillins     Antimicrobials this admission: Meropenem 4/19  >>    >>   Dose adjustments this admission:   Microbiology results: 4/18 UCx: pending       4/18 UA: LE(+) NO2(-)  WBC TNTC Thank you for allowing pharmacy to be a part of this patient's care.  Ranulfo Kall S 06/14/2017 12:58 AM

## 2017-06-14 NOTE — Care Management Note (Signed)
Case Management Note  Patient Details  Name: Alicia Reynolds MRN: [id number] Date of Birth: 08/29/1945  Subjective/Objective:    Admitted to [facility] under observation status with the diagnosis of urinary tract infection. Lives with friend, Gala RomneyDoug. Daughter is Selena BattenKim [phone]) Sees Dr Burnett ShengHedrick as primary care physician. Prescriptions are filled at [facility] on [address]. No home Health. No skilled nursing. Home oxygen since 2018 per [employer]. 2 liters per nasal cannula continuous. No other medical equipment in the home. Takes care of all basic activities of daily living herself, doesn't drive[facility] Decreased appetite. Friend will transport.               Action/Plan: Will continue to follow for plans   Expected Discharge Date:                  Expected Discharge Plan:     In-House Referral:     Discharge planning Services     Post Acute Care Choice:    Choice offered to:     DME Arranged:    DME Agency:     HH Arranged:    HH Agency:     Status of Service:     If discussed at [employer] Length of Stay Meetings, dates discussed:    Additional Comments:  Gwenette GreetBrenda S Nocole Zammit, RN MSN CCM Care Management [phone] 06/14/2017, 8:30 AM

## 2017-06-15 LAB — BASIC METABOLIC PANEL
ANION GAP: 5 (ref 5–15)
BUN: 47 mg/dL — ABNORMAL HIGH (ref 6–20)
CHLORIDE: 101 mmol/L (ref 101–111)
CO2: 32 mmol/L (ref 22–32)
Calcium: 8.9 mg/dL (ref 8.9–10.3)
Creatinine, Ser: 1.74 mg/dL — ABNORMAL HIGH (ref 0.44–1.00)
GFR, EST AFRICAN AMERICAN: 32 mL/min — AB (ref >60)
GFR, EST NON AFRICAN AMERICAN: 28 mL/min — AB (ref >60)
Glucose, Bld: 97 mg/dL (ref 65–99)
POTASSIUM: 3.7 mmol/L (ref 3.5–5.1)
Sodium: 138 mmol/L (ref 135–145)

## 2017-06-15 LAB — URINE CULTURE: CULTURE: NO GROWTH

## 2017-06-15 LAB — PROTIME-INR
INR: 2.73
Prothrombin Time: 28.7 seconds — ABNORMAL HIGH (ref 11.4–15.2)

## 2017-06-15 LAB — VANCOMYCIN, RANDOM: VANCOMYCIN RM: 21

## 2017-06-15 MED ORDER — VANCOMYCIN HCL IN DEXTROSE 750-5 MG/150ML-% IV SOLN
750.0000 mg | Freq: Once | INTRAVENOUS | Status: AC
  Administered 2017-06-15: 750 mg via INTRAVENOUS
  Filled 2017-06-15: qty 150

## 2017-06-15 NOTE — Progress Notes (Signed)
ANTICOAGULATION CONSULT NOTE - Consult  Pharmacy Consult for warfarin dosing Indication: atrial fibrillation  Allergies  Allergen Reactions  . Meperidine And Related   . Penicillins     Patient Measurements: Height: 5\' 5"  (165.1 cm) Weight: 87 lb 9.6 oz (39.7 kg)(bed weight) IBW/kg (Calculated) : 57   Vital Signs: Temp: 98.2 F (36.8 C) (04/20 0535) Temp Source: Oral (04/20 0535) BP: 118/65 (04/20 0535) Pulse Rate: 70 (04/20 0535)  Labs: Recent Labs    06/13/17 1646 06/14/17 0509 06/15/17 0517  HGB 12.7 11.7*  --   HCT 39.3 35.9  --   PLT 203 213  --   LABPROT  --  28.5* 28.7*  INR  --  2.70 2.73  CREATININE 1.51* 1.35* 1.74*  TROPONINI 0.03*  --   --     Estimated Creatinine Clearance: 17.8 mL/min (A) (by C-G formula based on SCr of 1.74 mg/dL (H)).   Medical History: Past Medical History:  Diagnosis Date  . Atrial fibrillation (HCC)   . Brain bleed (HCC)   . COPD (chronic obstructive pulmonary disease) (HCC)   . Head trauma   . Hypertension   . Hypothyroidism     Medications:  Home dose warfarin 4 mg Tuesday and Friday, 2 mg all other days  4/19  INR 2.70 Warfarin 4 mg 4/20 INR 2.73  Assessment: INR therapeutic on admission  Goal of Therapy:  INR 2-3    Plan:  INR therapeutic. Continue home regimen as above. INR daily while on antibiotics.  Marty HeckWang, Aryahi Denzler L 06/15/2017,7:12 AM

## 2017-06-15 NOTE — Progress Notes (Signed)
Patient being discharged home. Boyfriend here to take her home. DC instructions given and patient acknowledged understanding.  She had no questions.

## 2017-06-15 NOTE — Care Management (Signed)
Discharge to home today per Dr. Carole BinningPreddy. [place]ittany from [facility] updated Gwenette GreetBrenda S Tymeir Weathington RN MSN CCM Care Management [phone]

## 2017-06-15 NOTE — Progress Notes (Addendum)
Pharmacy Antibiotic Note  Alicia Reynolds is a 72 y.o. female admitted on 06/13/2017 with UTI.  Pharmacy has been consulted for meropenem dosing.  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 Last day of treatment should be 4/29.   Cefepime has been switched to meropenem while inpatient. Pharmacy consulted for Vancomycin monitoring and dosing. On admission patient has AKI and Vanc trough of 35.   Plan: Will hold vancomycin and dose by levels. Will order vancomycin random level 24 hours after supratherapeutic trough. Plan to order Vancomycin 750mg  when Vanc level less then 20.   4/20: Vancomycin level down to 21 at 0844. Based on two level kinetics, Ke ~0.21, half life ~33, Vd calculated 27.8. Anticipate regimen of vancomycin 750 mg IV q48 h with current renal function to give trough ~15.6. Pt's SCr trending up today. Will give one time dose of 750 mg IV x1 about when vanc level should be <20. Will continue to dose per levels for now (will order level ~36h from this dose).  Discussed vancomycin recommendations above with hospitalist. Plan to d/c pt today. Pt receiving one time 750 mg dose of vancomycin currently. Per MD, will inform pt that plan is to skip tomorrow's vanc dose and have home health/oupt check vanc level on Monday and redose from there.   Continue Meropenem 500 mg q 12 hours ordered.  Height: 5\' 5"  (165.1 cm) Weight: 87 lb 9.6 oz (39.7 kg)(bed weight) IBW/kg (Calculated) : 57  Temp (24hrs), Avg:97.9 F (36.6 C), Min:97.7 F (36.5 C), Max:98.2 F (36.8 C)  Recent Labs  Lab 06/13/17 1646 06/13/17 1652 06/14/17 0509 06/14/17 0845 06/15/17 0517 06/15/17 0844  WBC 7.2  --  7.8  --   --   --   CREATININE 1.51*  --  1.35*  --  1.74*  --   LATICACIDVEN  --  1.2  --   --   --   --   VANCOTROUGH  --   --   --  35*  --   --    VANCORANDOM  --   --   --   --   --  21    Estimated Creatinine Clearance: 17.8 mL/min (A) (by C-G formula based on SCr of 1.74 mg/dL (H)).    Allergies  Allergen Reactions  . Meperidine And Related   . Penicillins     Antimicrobials this admission: Meropenem 4/19  >>  Vancomycin  >>   Dose adjustments this admission:  Microbiology results: 4/18 UCx: pending   4/18 UA: LE(+) NO2(-)  WBC TNTC Thank you for allowing pharmacy to be a part of this patient's care.  Marty HeckWang, Arrie Zuercher L, PharmD, BCPS Clinical Pharmacist 06/15/2017 11:00 AM

## 2017-06-15 NOTE — Discharge Summary (Signed)
[employer] - [place] [facility]   PATIENT NAME: Alicia Reynolds    MR#:  [id number]  DATE OF BIRTH:  12-13-1945  DATE OF ADMISSION:  06/13/2017 ADMITTING PHYSICIAN: Oralia Manis, MD  DATE OF DISCHARGE: 06/15/2017  1:57 PM  PRIMARY CARE PHYSICIAN: Jerl Mina, MD   ADMISSION DIAGNOSIS:  Acute cystitis without hematuria [N30.00] Acute encephalopathy [G93.40]  DISCHARGE DIAGNOSIS:  Principal Problem:   UTI (urinary tract infection) Active Problems:   Acute encephalopathy   HTN (hypertension)   Hypothyroidism   AF (paroxysmal atrial fibrillation) (HCC)   GERD (gastroesophageal reflux disease)   AKI (acute kidney injury) (HCC)   Protein-calorie malnutrition, severe   SECONDARY DIAGNOSIS:   Past Medical History:  Diagnosis Date  . Atrial fibrillation (HCC)   . Brain bleed (HCC)   . COPD (chronic obstructive pulmonary disease) (HCC)   . Head trauma   . Hypertension   . Hypothyroidism      ADMITTING HISTORY Alicia Reynolds  is a 72 y.o. female who presents with her mental status.  Patient is unable to contribute to her HPI due to the same.  Here in the ED she was found on evaluation to have UTI, as well as some acute kidney injury.  Hospitalist were called for admission  HOSPITAL COURSE:  Patient admitted to medical floor.  She was worked up with CT head and chest x-ray. she received IV fluid hydration and continued IV vancomycin and IV meropenem antibiotics.  Urine analysis showed too numerous WBC.  Urine culture did not reveal any growth patient responded to the antibiotics well.  Her confusion resolved.  Mental status was back to baseline.  Her renal insufficiency also improved with IV fluids.  Her vancomycin trough level was elevated in hospital.  Vancomycin was held for 1 day.  She was again advised to hold vancomycin intravenously tomorrow.  Get levels checked again on Monday with home health services.  Patient already has existing home health services and  currently on IV vancomycin and IV cefepime antibiotics and she is supposed to take these antibiotics till April 29.  She was recently discharged from [facility] after having brain surgery.  She has a PICC line.  Patient hemodynamically stable will be discharged home and follow-up with primary care physician in the clinic and physician appointments at [facility].  Discharge plan discussed in detail with patient's boyfriend and patient.  CONSULTS OBTAINED:  None  DRUG ALLERGIES:   Allergies  Allergen Reactions  . Meperidine And Related   . Penicillins     DISCHARGE MEDICATIONS:   Allergies as of 06/15/2017      Reactions   Meperidine And Related    Penicillins       Medication List    TAKE these medications   bumetanide 1 MG tablet Commonly known as:  BUMEX Take 1 tablet by mouth every morning.   carvedilol 12.5 MG tablet Commonly known as:  COREG Take 1 tablet by mouth 2 (two) times daily.   ceFEPIme 2 g injection Commonly known as:  MAXIPIME Inject 2 g into the vein every 12 (twelve) hours.   digoxin 0.125 MG tablet Commonly known as:  LANOXIN Take 1 tablet by mouth every morning.   KLOR-CON M20 20 MEQ tablet Generic drug:  potassium chloride SA Take 1 tablet by mouth 2 (two) times daily.   pantoprazole 40 MG tablet Commonly known as:  PROTONIX Take 1 tablet by mouth every morning.   tamsulosin 0.4 MG Caps capsule  Commonly known as:  FLOMAX Take 1 capsule by mouth daily after breakfast.   vancomycin 750 MG Solr injection Commonly known as:  VANCOCIN Inject 0.75 g into the vein daily.   vitamin B-12 1000 MCG tablet Commonly known as:  CYANOCOBALAMIN Take 1 tablet by mouth daily.   warfarin 4 MG tablet Commonly known as:  COUMADIN Take 4MG  by mouth every Tuesday and Friday evening and take 2MG  by mouth all other evenings       Today  Patient seen and evaluated today No confusion No shortness of breath No fever and chills Tolerating diet  well  VITAL SIGNS:  Blood pressure 118/65, pulse 70, temperature 98.2 F (36.8 C), temperature source Oral, resp. rate 17, height 5\' 5"  (1.651 m), weight 39.7 kg (87 lb 9.6 oz), SpO2 99 %.  I/O:    Intake/Output Summary (Last 24 hours) at 06/15/2017 1504 Last data filed at 06/15/2017 1009 Gross per 24 hour  Intake 480 ml  Output -  Net 480 ml    PHYSICAL EXAMINATION:  Physical Exam  GENERAL:  72 y.o.-year-old patient lying in the bed with no acute distress.  LUNGS: Normal breath sounds bilaterally, no wheezing, rales,rhonchi or crepitation. No use of accessory muscles of respiration.  CARDIOVASCULAR: S1, S2 normal. No murmurs, rubs, or gallops.  ABDOMEN: Soft, non-tender, non-distended. Bowel sounds present. No organomegaly or mass.  NEUROLOGIC: Moves all 4 extremities. PSYCHIATRIC: The patient is alert and oriented x 3.  SKIN: No obvious rash, lesion, or ulcer.   DATA REVIEW:   CBC Recent Labs  Lab 06/14/17 0509  WBC 7.8  HGB 11.7*  HCT 35.9  PLT 213    Chemistries  Recent Labs  Lab 06/13/17 1646  06/15/17 0517  NA 138   < > 138  K 3.8   < > 3.7  CL 99*   < > 101  CO2 27   < > 32  GLUCOSE 99   < > 97  BUN 43*   < > 47*  CREATININE 1.51*   < > 1.74*  CALCIUM 9.8   < > 8.9  AST 33  --   --   ALT 16  --   --   ALKPHOS 62  --   --   BILITOT 0.8  --   --    < > = values in this interval not displayed.    Cardiac Enzymes Recent Labs  Lab 06/13/17 1646  TROPONINI 0.03*    Microbiology Results  Results for orders placed or performed during the hospital encounter of 06/13/17  Urine culture     Status: None   Collection Time: 06/13/17  9:05 PM  Result Value Ref Range Status   Specimen Description   Final    URINE, RANDOM Performed at [facility], [address] [place], [place] [address]    Special Requests   Final    NONE Performed at [facility], [address] [place], [place] [address]    Culture   Final    NO  GROWTH Performed at [facility], [address] [place], [place] [address]    Report Status 06/15/2017 FINAL  Final    RADIOLOGY:  Dg Chest 2 View  Result Date: 06/13/2017 CLINICAL DATA:  Altered mental status, hypotension, recent head trauma and intracranial hemorrhage EXAM: CHEST - 2 VIEW COMPARISON:  04/11/2017 FINDINGS: LEFT subclavian AICD leads project at RIGHT atrium, RIGHT ventricle and coronary sinus. RIGHT arm PICC line tip  projects over SVC. Enlargement of cardiac silhouette post MVR. Mild pulmonary vascular congestion. Atherosclerotic calcification aorta. Emphysematous and bronchitic changes consistent with COPD. No acute infiltrate, pleural effusion or pneumothorax. Bones appear diffusely demineralized. IMPRESSION: Enlargement of cardiac silhouette with pulmonary vascular congestion post AICD and MVR. COPD changes without acute infiltrate. Electronically Signed   By: Ulyses Southward M.D.   On: 06/13/2017 17:23   Ct Head Wo Contrast  Result Date: 06/13/2017 CLINICAL DATA:  Altered mental status and hypotension. History of recent surgery for head trauma resulting in intracranial hemorrhage. EXAM: CT HEAD WITHOUT CONTRAST TECHNIQUE: Contiguous axial images were obtained from the base of the skull through the vertex without intravenous contrast. COMPARISON:  Head CT dated 04/11/2017. FINDINGS: Brain: Mild generalized age related parenchymal atrophy with commensurate dilatation of the ventricles and sulci. Persistent mild sulcal effacement throughout the LEFT frontoparietal lobe, but no hemorrhage, edema or other evidence of acute parenchymal abnormality. Thin residual extra-axial hemorrhage underlying the LEFT temporoparietal craniotomy, measuring 1-2 mm thickness, without appreciable mass effect. The previously demonstrated large extra-axial hemorrhage is otherwise resolved. No residual midline shift. Vascular: There are chronic calcified atherosclerotic changes of the large vessels at the  skull base. No unexpected hyperdense vessel. Skull: Expected surgical changes of LEFT frontotemporoparietal craniotomy. No evidence of surgical complicating feature. No acute appearing osseous abnormality Sinuses/Orbits: No acute finding. Other: None. IMPRESSION: 1. No acute findings. No parenchymal hemorrhage or edema. No evidence of post surgical complicating feature. 2. Thin residual extra-axial hemorrhage underlying the LEFT temporoparietal component of the craniotomy, measuring 1-2 mm thickness, without mass effect. The previously demonstrated large LEFT-sided extra-axial hemorrhage is otherwise resolved. No midline shift. Electronically Signed   By: Bary Richard M.D.   On: 06/13/2017 17:48    Follow up with PCP in 1 week.  Management plans discussed with the patient, family and they are in agreement.  CODE STATUS:     Code Status Orders  (From admission, onward)        Start     Ordered   06/14/17 0141  Full code  Continuous     06/14/17 0140    Code Status History    This patient has a current code status but no historical code status.      TOTAL TIME TAKING CARE OF THIS PATIENT ON DAY OF DISCHARGE: more than 33 minutes.   Ihor Austin M.D on 06/15/2017 at 3:04 PM  Between 7am to 6pm - Pager - [phone]  After 6pm go to [email or web address] - password [email or web address]  [facility] [place] [facility]  Office  [phone]  CC: Primary care physician; Jerl Mina, MD  Note: This dictation was prepared with Dragon dictation along with smaller phrase technology. Any transcriptional errors that result from this process are unintentional.

## 2017-06-16 ENCOUNTER — Encounter: Payer: Self-pay | Admitting: Emergency Medicine

## 2017-06-16 ENCOUNTER — Emergency Department
Admission: EM | Admit: 2017-06-16 | Discharge: 2017-06-16 | Disposition: A | Discharge status: Home / Self Care | Payer: Medicare PPO | Attending: Emergency Medicine | Admitting: Emergency Medicine

## 2017-06-16 ENCOUNTER — Other Ambulatory Visit: Payer: Self-pay

## 2017-06-16 DIAGNOSIS — Z9189 Other specified personal risk factors, not elsewhere classified: Secondary | ICD-10-CM

## 2017-06-16 DIAGNOSIS — Z7901 Long term (current) use of anticoagulants: Secondary | ICD-10-CM | POA: Insufficient documentation

## 2017-06-16 DIAGNOSIS — E039 Hypothyroidism, unspecified: Secondary | ICD-10-CM | POA: Insufficient documentation

## 2017-06-16 DIAGNOSIS — Z452 Encounter for adjustment and management of vascular access device: Secondary | ICD-10-CM | POA: Insufficient documentation

## 2017-06-16 DIAGNOSIS — Z79899 Other long term (current) drug therapy: Secondary | ICD-10-CM | POA: Diagnosis not present

## 2017-06-16 DIAGNOSIS — F17200 Nicotine dependence, unspecified, uncomplicated: Secondary | ICD-10-CM | POA: Insufficient documentation

## 2017-06-16 DIAGNOSIS — I10 Essential (primary) hypertension: Secondary | ICD-10-CM | POA: Insufficient documentation

## 2017-06-16 NOTE — ED Notes (Signed)
Pt presented to ED with reports of clotted PICC line, upon assessment, line was able to pulled back with blood return and easily flushed. Pt's boyfriend states he did not realize there was a clamp on the tubing which was why he was not able to flush it.  Dr. Alphonzo LemmingsMcShane notified and was able to see patient in triage.  Pt had no other medical complaints and was not in any distress.  Boyfriend plans to administer morning dose of atbx when they get back home.  DC instructions discussed with patient and boyfriend to return if any more issues with PICC line or any other medical issues arise. Both patient and boyfriend verbalized understanding.

## 2017-06-16 NOTE — ED Triage Notes (Signed)
Pt arrived via POV from home with reports of clotted PICC line, pt's boyfriend states he tried to use it last night and it was clogged.  Pt receiving IV atbx 2x per day.   Pt missed night time dose of atbx.

## 2017-06-16 NOTE — Discharge Instructions (Signed)
Return to the emergency room for any new or worrisome symptoms getting chest pain, shortness of breath, signs of infection around the PICC line, fever, or inability to use the PICC line in a way that is inhibiting blood draws or medications.

## 2017-06-16 NOTE — ED Provider Notes (Signed)
[facility] Emergency Department Provider Note  ____________________________________________   I have reviewed the triage vital signs and the nursing notes. Where available I have reviewed prior notes and, if possible and indicated, outside hospital notes.    HISTORY  Chief Complaint Vascular Access Problem    HPI LIVIA TARR is a 72 y.o. female   The PICC line, the PICC line was clamped and they could not use it they did not realize there is a clamp on there.  Triage nurse unclamped it now they have no complaints.  They want to go home.  PICC line flushes very well per nurse practitioner   Past Medical History:  Diagnosis Date  . Atrial fibrillation (HCC)   . Brain bleed (HCC)   . COPD (chronic obstructive pulmonary disease) (HCC)   . Head trauma   . Hypertension   . Hypothyroidism     Patient Active Problem List   Diagnosis Date Noted  . Acute encephalopathy 06/14/2017  . UTI (urinary tract infection) 06/14/2017  . HTN (hypertension) 06/14/2017  . Hypothyroidism 06/14/2017  . AF (paroxysmal atrial fibrillation) (HCC) 06/14/2017  . GERD (gastroesophageal reflux disease) 06/14/2017  . AKI (acute kidney injury) (HCC) 06/14/2017  . Protein-calorie malnutrition, severe 06/14/2017    Past Surgical History:  Procedure Laterality Date  . BRAIN SURGERY    . PACEMAKER INSERTION      Prior to Admission medications   Medication Sig Start Date End Date Taking? Authorizing Provider  bumetanide (BUMEX) 1 MG tablet Take 1 tablet by mouth every morning. 06/12/16   [provider]  carvedilol (COREG) 12.5 MG tablet Take 1 tablet by mouth 2 (two) times daily. 10/26/16   [provider]  ceFEPIme (MAXIPIME) 2 g injection Inject 2 g into the vein every 12 (twelve) hours. 05/23/17 06/24/17  [provider]  digoxin (LANOXIN) 0.125 MG tablet Take 1 tablet by mouth every morning. 03/18/17   [provider]  pantoprazole  (PROTONIX) 40 MG tablet Take 1 tablet by mouth every morning. 05/24/17   [provider]  potassium chloride SA (KLOR-CON M20) 20 MEQ tablet Take 1 tablet by mouth 2 (two) times daily. 06/12/16   [provider]  tamsulosin (FLOMAX) 0.4 MG CAPS capsule Take 1 capsule by mouth daily after breakfast. 05/24/17   [provider]  vancomycin (VANCOCIN) 750 MG SOLR injection Inject 0.75 g into the vein daily. 05/24/17 06/24/17  [provider]  vitamin B-12 (CYANOCOBALAMIN) 1000 MCG tablet Take 1 tablet by mouth daily.    [provider]  warfarin (COUMADIN) 4 MG tablet Take 4MG  by mouth every Tuesday and Friday evening and take 2MG  by mouth all other evenings 03/11/17   [provider]    Allergies Meperidine and related and Penicillins  Family History  Problem Relation Age of Onset  . Breast cancer Mother   . Heart attack Father     Social History Social History   Tobacco Use  . Smoking status: Current Every Day Smoker  . Smokeless tobacco: Never Used  Substance Use Topics  . Alcohol use: Not Currently  . Drug use: Not Currently    Review of Systems No complaints   ____________________________________________   PHYSICAL EXAM:  VITAL SIGNS: ED Triage Vitals  Enc Vitals Group     BP 06/16/17 0909 (!) 110/52     Pulse Rate 06/16/17 0909 71     Resp 06/16/17 0909 18     Temp 06/16/17 0909 97.7 F (  36.5 C)     Temp Source 06/16/17 0909 Oral     SpO2 06/16/17 0909 90 %     Weight 06/16/17 0908 87 lb (39.5 kg)     Height 06/16/17 0908 5\' 5"  (1.651 m)     Head Circumference --      Peak Flow --      Pain Score --      Pain Loc --      Pain Edu? --      Excl. in GC? --     Constitutional: Alert and oriented.  In no acute distress Cardiovascular: Normal rate, regular rhythm. Grossly normal heart sounds.  Good peripheral circulation. Respiratory: Normal respiratory effort.  No retractions. Lungs CTAB.  Musculoskeletal: No  lower extremity tenderness, no upper extremity tenderness.  PICC line in place flushes well.  Skin:  Skin is warm, dry and intact. No rash noted. Psychiatric: Mood and affect are normal. Speech and behavior are normal.  ____________________________________________   LABS (all labs ordered are listed, but only abnormal results are displayed)  Labs Reviewed - No data to display  Pertinent labs  results that were available during my care of the patient were reviewed by me and considered in my medical decision making (see chart for details). ____________________________________________  EKG  I personally interpreted any EKGs ordered by me or triage  ____________________________________________  RADIOLOGY  Pertinent labs & imaging results that were available during my care of the patient were reviewed by me and considered in my medical decision making (see chart for details). If possible, patient and/or family made aware of any abnormal findings.  No results found. ____________________________________________    PROCEDURES  Procedure(s) performed: None  Procedures  Critical Care performed: None  ____________________________________________   INITIAL IMPRESSION / ASSESSMENT AND PLAN / ED COURSE  Pertinent labs & imaging results that were available during my care of the patient were reviewed by me and considered in my medical decision making (see chart for details).  Patient is here for PICC line assessment, she has a PICC line that the husband apparently forgot to unclamp before he tried to use it and could not use it when we unclamped it works just fine, they have no other complaints.  Sats are 90s, patient is normally on home oxygen "when I feel like I needed", she does not feel like she needs it right now she is not short of breath she is breathing fine, if she states she feels any shortness of breath she knows what to do when she is not here for that.  In any event, PICC  line looks good, does not appear to be infected flushes well draws well will discharge.  It is Odis Lusteraster and the family does not want to be here any longer    ____________________________________________   FINAL CLINICAL IMPRESSION(S) / ED DIAGNOSES  Final diagnoses:  None      This chart was dictated using voice recognition software.  Despite best efforts to proofread,  errors can occur which can change meaning.      Jeanmarie PlantMcShane, James A, MD 06/16/17 [phone]

## 2017-06-19 LAB — BLOOD GAS, VENOUS
Acid-Base Excess: 4 mmol/L — ABNORMAL HIGH (ref 0.0–2.0)
Bicarbonate: 29.2 mmol/L — ABNORMAL HIGH (ref 20.0–28.0)
O2 Saturation: 63.1 %
PATIENT TEMPERATURE: 37
PH VEN: 7.42 (ref 7.250–7.430)
pCO2, Ven: 45 mmHg (ref 44.0–60.0)
pO2, Ven: 32 mmHg (ref 32.0–45.0)

## 2017-10-21 ENCOUNTER — Emergency Department: Payer: Medicare PPO

## 2017-10-21 ENCOUNTER — Encounter: Payer: Self-pay | Admitting: Emergency Medicine

## 2017-10-21 ENCOUNTER — Emergency Department
Admission: EM | Admit: 2017-10-21 | Discharge: 2017-10-21 | Payer: Medicare PPO | Attending: Emergency Medicine | Admitting: Emergency Medicine

## 2017-10-21 ENCOUNTER — Other Ambulatory Visit: Payer: Self-pay

## 2017-10-21 DIAGNOSIS — Y829 Unspecified medical devices associated with adverse incidents: Secondary | ICD-10-CM | POA: Diagnosis not present

## 2017-10-21 DIAGNOSIS — T8141XA Infection following a procedure, superficial incisional surgical site, initial encounter: Secondary | ICD-10-CM | POA: Diagnosis not present

## 2017-10-21 DIAGNOSIS — R10819 Abdominal tenderness, unspecified site: Secondary | ICD-10-CM | POA: Diagnosis not present

## 2017-10-21 DIAGNOSIS — L03311 Cellulitis of abdominal wall: Secondary | ICD-10-CM | POA: Insufficient documentation

## 2017-10-21 DIAGNOSIS — Z431 Encounter for attention to gastrostomy: Secondary | ICD-10-CM | POA: Diagnosis present

## 2017-10-21 DIAGNOSIS — L089 Local infection of the skin and subcutaneous tissue, unspecified: Secondary | ICD-10-CM

## 2017-10-21 DIAGNOSIS — T148XXA Other injury of unspecified body region, initial encounter: Secondary | ICD-10-CM

## 2017-10-21 DIAGNOSIS — E46 Unspecified protein-calorie malnutrition: Secondary | ICD-10-CM | POA: Insufficient documentation

## 2017-10-21 LAB — COMPREHENSIVE METABOLIC PANEL
ALT: 34 U/L (ref 0–44)
ANION GAP: 9 (ref 5–15)
AST: 46 U/L — ABNORMAL HIGH (ref 15–41)
Albumin: 3.6 g/dL (ref 3.5–5.0)
Alkaline Phosphatase: 95 U/L (ref 38–126)
BILIRUBIN TOTAL: 0.6 mg/dL (ref 0.3–1.2)
BUN: 33 mg/dL — ABNORMAL HIGH (ref 8–23)
CALCIUM: 9.7 mg/dL (ref 8.9–10.3)
CO2: 29 mmol/L (ref 22–32)
Chloride: 93 mmol/L — ABNORMAL LOW (ref 98–111)
Creatinine, Ser: 0.65 mg/dL (ref 0.44–1.00)
GFR calc non Af Amer: >60 mL/min (ref >60)
Glucose, Bld: 133 mg/dL — ABNORMAL HIGH (ref 70–99)
POTASSIUM: 4.1 mmol/L (ref 3.5–5.1)
SODIUM: 131 mmol/L — AB (ref 135–145)
TOTAL PROTEIN: 7.6 g/dL (ref 6.5–8.1)

## 2017-10-21 LAB — CBC WITH DIFFERENTIAL/PLATELET
BASOS ABS: 0.1 10*3/uL (ref 0–0.1)
Basophils Relative: 1 %
Eosinophils Absolute: 0.1 10*3/uL (ref 0–0.7)
Eosinophils Relative: 1 %
HCT: 36.5 % (ref 35.0–47.0)
HEMOGLOBIN: 12.3 g/dL (ref 12.0–16.0)
LYMPHS PCT: 8 %
Lymphs Abs: 0.7 10*3/uL — ABNORMAL LOW (ref 1.0–3.6)
MCH: 29.7 pg (ref 26.0–34.0)
MCHC: 33.8 g/dL (ref 32.0–36.0)
MCV: 87.8 fL (ref 80.0–100.0)
Monocytes Absolute: 0.9 10*3/uL (ref 0.2–0.9)
Monocytes Relative: 10 %
NEUTROS ABS: 7.8 10*3/uL — AB (ref 1.4–6.5)
NEUTROS PCT: 80 %
Platelets: 280 10*3/uL (ref 150–440)
RBC: 4.16 MIL/uL (ref 3.80–5.20)
RDW: 16.7 % — ABNORMAL HIGH (ref 11.5–14.5)
WBC: 9.7 10*3/uL (ref 3.6–11.0)

## 2017-10-21 LAB — PROTIME-INR
INR: 3.86
PROTHROMBIN TIME: 37.6 s — AB (ref 11.4–15.2)

## 2017-10-21 MED ORDER — ACETAMINOPHEN 325 MG PO TABS
650.0000 mg | ORAL_TABLET | Freq: Once | ORAL | Status: AC
  Administered 2017-10-21: 650 mg via ORAL
  Filled 2017-10-21: qty 2

## 2017-10-21 MED ORDER — SODIUM CHLORIDE 0.9 % IV BOLUS
1000.0000 mL | Freq: Once | INTRAVENOUS | Status: AC
  Administered 2017-10-21: 1000 mL via INTRAVENOUS

## 2017-10-21 MED ORDER — IOHEXOL 300 MG/ML  SOLN
75.0000 mL | Freq: Once | INTRAMUSCULAR | Status: AC | PRN
  Administered 2017-10-21: 75 mL via INTRAVENOUS

## 2017-10-21 NOTE — ED Notes (Signed)
EMTALA REVIEWED 

## 2017-10-21 NOTE — ED Notes (Signed)
[facility]  CALLED  FOR  CONSULT

## 2017-10-21 NOTE — ED Provider Notes (Addendum)
[facility] Emergency Department Provider Note ____________________________________________   First MD Initiated Contact with Patient 10/21/17 1234     (approximate)  I have reviewed the triage vital signs and the nursing notes.   HISTORY  Chief Complaint Abdominal Pain   HPI RALEY NOVICKI is a 72 y.o. female with a history of atrial fibrillation, brain hemorrhage status post craniectomy who is presenting to the emergency department with abdominal pain around the site of her gastrostomy tube as well as exudate coming from the nonhealing wounds of her scalp.  Is not reporting any fever.  Says that the pain around her gastrostomy site is severe she is seen exudate as well.  Says that she is able to operate the tube without issue.  Past Medical History:  Diagnosis Date  . Atrial fibrillation (HCC)   . Brain bleed (HCC)   . COPD (chronic obstructive pulmonary disease) (HCC)   . Head trauma   . Hypertension   . Hypothyroidism     Patient Active Problem List   Diagnosis Date Noted  . Acute encephalopathy 06/14/2017  . UTI (urinary tract infection) 06/14/2017  . HTN (hypertension) 06/14/2017  . Hypothyroidism 06/14/2017  . AF (paroxysmal atrial fibrillation) (HCC) 06/14/2017  . GERD (gastroesophageal reflux disease) 06/14/2017  . AKI (acute kidney injury) (HCC) 06/14/2017  . Protein-calorie malnutrition, severe 06/14/2017    Past Surgical History:  Procedure Laterality Date  . BRAIN SURGERY    . PACEMAKER INSERTION      Prior to Admission medications   Medication Sig Start Date End Date Taking? Authorizing Provider  bumetanide (BUMEX) 1 MG tablet Take 1 tablet by mouth every morning. 06/12/16   [provider]  carvedilol (COREG) 12.5 MG tablet Take 1 tablet by mouth 2 (two) times daily. 10/26/16   [provider]  digoxin (LANOXIN) 0.125 MG tablet Take 1 tablet by mouth every morning. 03/18/17   [provider]    pantoprazole (PROTONIX) 40 MG tablet Take 1 tablet by mouth every morning. 05/24/17   [provider]  potassium chloride SA (KLOR-CON M20) 20 MEQ tablet Take 1 tablet by mouth 2 (two) times daily. 06/12/16   [provider]  tamsulosin (FLOMAX) 0.4 MG CAPS capsule Take 1 capsule by mouth daily after breakfast. 05/24/17   [provider]  vitamin B-12 (CYANOCOBALAMIN) 1000 MCG tablet Take 1 tablet by mouth daily.    [provider]  warfarin (COUMADIN) 4 MG tablet Take 4MG  by mouth every Tuesday and Friday evening and take 2MG  by mouth all other evenings 03/11/17   [provider]    Allergies Meperidine and related and Penicillins  Family History  Problem Relation Age of Onset  . Breast cancer Mother   . Heart attack Father     Social History Social History   Tobacco Use  . Smoking status: Current Every Day Smoker  . Smokeless tobacco: Never Used  Substance Use Topics  . Alcohol use: Not Currently  . Drug use: Not Currently    Review of Systems  Constitutional: No fever/chills Eyes: No visual changes. ENT: No sore throat. Cardiovascular: Denies chest pain. Respiratory: Denies shortness of breath. Gastrointestinal:  No nausea, no vomiting.  No diarrhea.  No constipation. Genitourinary: Negative for dysuria. Musculoskeletal: Negative for back pain. Skin: As above Neurological: Negative for headaches, focal weakness or numbness.   ____________________________________________   PHYSICAL EXAM:  VITAL SIGNS: ED Triage Vitals  Enc Vitals Group     BP 10/21/17  1053 129/61     Pulse Rate 10/21/17 1053 68     Resp 10/21/17 1053 18     Temp 10/21/17 1053 (!) 97.5 F (36.4 C)     Temp Source 10/21/17 1053 Oral     SpO2 10/21/17 1053 95 %     Weight 10/21/17 1058 95 lb (43.1 kg)     Height 10/21/17 1058 5\' 6"  (1.676 m)     Head Circumference --      Peak Flow --      Pain Score 10/21/17 1058 10     Pain Loc --      Pain Edu?  --      Excl. in GC? --     Constitutional: Alert and oriented.  Patient cachectic. Eyes: Conjunctivae are normal.  Head: Approximately 12 cm open surgical wound that is filled with exudate running from the occipital parietal region and a dorsal to ventral direction towards the forehead.  No surrounding erythema.  There is an additional wound to the occipital parietal region also oriented in a dorsal ventral orientation that is filled with exudate that is approximately 3 cm in length. Nose: No congestion/rhinnorhea. Mouth/Throat: Mucous membranes are moist.  Neck: No stridor.   Cardiovascular: Normal rate, regular rhythm. Grossly normal heart sounds. Respiratory: Normal respiratory effort.  No retractions. Lungs CTAB. Gastrointestinal: Moderate tenderness palpation surrounding the site of the gastrostomy tube.  There is a 4 cm wheal of erythema with mild induration.  Small amount of exudate able to be expressed from around the gastrostomy tube.  No crepitus.  No distention.  Musculoskeletal: No lower extremity tenderness nor edema.   Neurologic:  Normal speech and language. No gross focal neurologic deficits are appreciated. Skin:  Skin is warm, dry and intact. No rash noted. Psychiatric: Mood and affect are normal. Speech and behavior are normal.  ____________________________________________   LABS (all labs ordered are listed, but only abnormal results are displayed)  Labs Reviewed  COMPREHENSIVE METABOLIC PANEL - Abnormal; Notable for the following components:      Result Value   Sodium 131 (*)    Chloride 93 (*)    Glucose, Bld 133 (*)    BUN 33 (*)    AST 46 (*)    All other components within normal limits  CBC WITH DIFFERENTIAL/PLATELET - Abnormal; Notable for the following components:   RDW 16.7 (*)    Neutro Abs 7.8 (*)    Lymphs Abs 0.7 (*)    All other components within normal limits  AEROBIC/ANAEROBIC CULTURE (SURGICAL/DEEP WOUND)  CULTURE, FUNGUS WITHOUT SMEAR    ____________________________________________  EKG   ____________________________________________  RADIOLOGY  No acute abnormality seen on the CAT scan of the abdomen and pelvis. ____________________________________________   PROCEDURES  Procedure(s) performed:   Procedures  Critical Care performed:   ____________________________________________   INITIAL IMPRESSION / ASSESSMENT AND PLAN / ED COURSE  Pertinent labs & imaging results that were available during my care of the patient were reviewed by me and considered in my medical decision making (see chart for details).  DDX: Wound infections, cellulitis, sepsis, electrolyte abnormality, gastrostomy tube malfunction As part of my medical decision making, I reviewed the following data within the electronic MEDICAL RECORD NUMBER Old chart reviewed  ----------------------------------------- 2:59 PM on 10/21/2017 -----------------------------------------  I discussed the case with the neurosurgeon on-call here at [facility], Dr. Adriana Simas, who says that the patient has had a wound care with plastics at [facility].  I then call plastics at [facility]  who [facility]  I then discussed the case with Dr. Shaune PollackFecci, of [facility] neuro[facility] who accepts the patient onto his service.  He says that the patient's delayed wound closure had been related in part to the patient's malnourished status.  They had placed the PEG tube in order to improve her nutritional status and help with the wound healing in hopes of then performing a revision surgery which the patient was reported to have refused.  Patient to be transferred ER to ER.  Dr. Shaune PollackFecci requests anaerobic as well as aerobic and fungal cultures of the wound prior to discharge.  Patient aware of diagnosis as well as treatment plan willing to comply.  No antibiotics at this time.  [facility]  Additionally, I loosened the gastrostomy tube disc holding traction on the  abdominal wall and this relieves some of the patient's pain. ____________________________________________   FINAL CLINICAL IMPRESSION(S) / ED DIAGNOSES  Wound infection.  Abdominal wall cellulitis.  NEW MEDICATIONS STARTED DURING THIS VISIT:  New Prescriptions   No medications on file     Note:  This document was prepared using Dragon voice recognition software and may include unintentional dictation errors.     Myrna BlazerSchaevitz, Tamer Baughman Matthew, MD 10/21/17 1501    Pershing ProudSchaevitz, Myra Rudeavid Matthew, MD 10/21/17 1501

## 2017-10-21 NOTE — ED Triage Notes (Signed)
Pt reports pain at her G-tube site. States that the pain has been there since the tube was placed. Pt also reports pus coming from her wound. Pt reports she had a craniotomy done and she is worried it may be infected.

## 2017-10-21 NOTE — ED Notes (Signed)
Wound redressed with xeroform, gauze, and kerlix.

## 2017-10-21 NOTE — ED Notes (Signed)
Pt gave verbal consent for significant other (Douglas Menkel) to sign consent for transfer.

## 2017-10-21 NOTE — ED Notes (Signed)
[facility]  CALLED  FOR  TRANSPORT  TO  [facility]  ER  INFORMED  RN  [facility]

## 2017-10-26 LAB — AEROBIC/ANAEROBIC CULTURE (SURGICAL/DEEP WOUND): SPECIAL REQUESTS: NORMAL

## 2017-10-26 LAB — AEROBIC/ANAEROBIC CULTURE W GRAM STAIN (SURGICAL/DEEP WOUND)

## 2017-11-11 LAB — CULTURE, FUNGUS WITHOUT SMEAR: Special Requests: NORMAL

## 2018-04-30 ENCOUNTER — Telehealth: Payer: Self-pay | Admitting: Nurse Practitioner

## 2018-04-30 NOTE — Telephone Encounter (Signed)
I called Alicia Reynolds and schedule In-home PC appointment for June 02, 2018, Monday at 2pm.

## 2018-05-29 ENCOUNTER — Telehealth: Payer: Self-pay

## 2018-05-29 NOTE — Telephone Encounter (Signed)
Call to change in person visit to TELEHEALTH. Spoke with [employer].Visit scheduled via Telehealth. Patient/caregiver agreed/understand and verbal consent obtained.

## 2018-06-02 ENCOUNTER — Encounter: Payer: Self-pay | Admitting: Nurse Practitioner

## 2018-06-02 ENCOUNTER — Other Ambulatory Visit: Payer: Medicare PPO | Admitting: Nurse Practitioner

## 2018-06-02 DIAGNOSIS — Z515 Encounter for palliative care: Secondary | ICD-10-CM | POA: Insufficient documentation

## 2018-06-02 NOTE — Progress Notes (Signed)
Therapist, nutritional Palliative Care Consult Note Telephone: [phone]  Fax: [phone]  PATIENT NAME: Alicia Reynolds DOB: 09-May-1945 MRN: [id number]  PRIMARY CARE PROVIDER:   Jerl Mina, MD  REFERRING PROVIDER:  Jerl Mina, MD [address] [facility] [place], [place] [address]  RESPONSIBLE PARTY: Alicia Reynolds patients boyfriend/24hr caregiver [phone] Daughter Alicia Reynolds [phone]  Due to the COVID-19 crisis, this visit was done via telemedicine/telephone from my office and it was initiated and consent by this patient and or family. Alicia Reynolds, boyfriend was unable to get telehealth up so visit proceeded as scheduled as a telephone visit  RECOMMENDATIONS and PLAN:  1. Palliative care encounter Z51.5; Palliative medicine team will continue to support patient, patient's family, and medical team. Visit consisted of counseling and education dealing with the complex and emotionally intense issues of symptom management and palliative care in the setting of serious and potentially life-threatening illness  ASSESSMENT:     I called Alicia Reynolds, Alicia Reynolds's boyfriend who is her caregiver. Attempted to get Telehealth set up but he wasn't able to do on his end. Went through instructions. Alicia Reynolds agreed to switch to telephone visit. We talked about past medical history. We talked about the events surrounding the hospitalization with fall and subdural. We talked about her current functional abilities. She is able to stand up and take steps, at times toilet herself. Other times she is incontinent. She does perform her adl's. Alicia Reynolds endorses her daily routine consists of laying on the couch 24 hours a day with her face in a pillow. She does feed herself but appetite has been poor. Alicia Reynolds endorses that she has a very bad attitude most of the time. We talked about traumatic head injury and cognition. Alicia Reynolds endorses that he feels like she's not able  to make good decisions and judgment is impaired. Alicia Reynolds talked about wound to her head when she is receiving Home Health Services for wound care at this time. Alicia Reynolds endorses that that should be coming to an end soon though. He talked about physicians wanting this Alicia Reynolds to return for another surgery concerning the wound. Alicia Reynolds endorses Alicia Reynolds is currently not agreeable to rehospitalization at this time and wants to wait until after she is able to visit her great-granddaughter in June. Alicia Reynolds talked about her overall functional decline. He talked about or appetite. He talked about challenges as a caregiver including caregiver fatigue. He talked about family dynamics as her daughter is her health care power of attorney. Alicia Reynolds endorses that he does not feel like Alicia Reynolds is able to make good decision making at this time. Alicia Reynolds endorses there is an estrange relationship between Alicia Reynolds daughter and himself. Alicia Reynolds shared that he feels like he's caught in the middle but yet being responsible for caring for her. We talked about role of palliative care and plan of care. We talked about medical goals of care including aggressive versus conservative versus comfort care. We talked back code status says Alicia Reynolds is a full code. Alicia Reynolds endorses that Alicia Reynolds wants everything done to her aggressive, CPR, ventilator, aggressive interventions but when the recommendations come to Alicia. Reynolds such as rehospitalization for surgery she declines. I asked Alicia Reynolds if hospice was discussed in past. Alicia Reynolds endorses that it was but the daughter was resistant. We talked about role of palliative care and plan of care. Discuss with Alicia Reynolds will contact home health nurse and number received for further discussion about Alicia. Reynolds's clinical  condition. Will also contact this funks daughter for further discussion for medical goals of care and possible hospice transition if wishes are for Comfort Care.  8 / 26 / 2019 weight 95 lb BMI  16  I spent 45 minutes providing this consultation,  from 3:00pm to 3:45pm. More than 50% of the time in this consultation was spent coordinating communication.   HISTORY OF PRESENT ILLNESS:  Alicia Reynolds is a 73 y.o. year old female with multiple medical problems including COPD, atrial fibrillation, ICD, diastolic heart failure, mitral valve prolapse, MI 2006, traumatic fall  2 / 14 / 2019 with life-threatening subdural hematoma s/p post craniotomy s/p necrotic wound infection s/p scalp wound revision, advancement, placement of Integra dermal graph s/p debridement of scalp soft tissue defect, hypertension, hypothyroidism, gerd, pacemaker insertion, severe protein calorie malnutrition, right forearm fracture s/p orif. Seen in emergency department partment 8 / 26 / 2019 for abdominal pain around site of gastrostomy tube as well as exudate coming from non-healing wounds of her scalp. Her documented assessment 12 centimeter open surgical wound filled with exudate running from the occipital parietal region and a dorsal to ventricle direction toward his forehead. Additional wound to the occipital parietal region also oriented in dorsal ventral orientation that is filled with exudate that is approximately 3 cm in length. Wound care with plastics at [facility]. Dr Shaune Pollack of [facility] accepted the patient onto his service. Her documentation patients delayed wound closure related in part two patients malnourished status. Peg tube placed in order to improve nutritional status and help wound healing and hopes of performing a revision surgery which the patient had refused. Transferred to [facility]. Her documentation she was very weak essentially wheelchair dependent due to inability to ambulate secondary to fatigue. No appetite nor desire for food. 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 Palliative consult 9 / 6 / 2019 at that time she was seeking aggressive interventions, IV antibiotics through a  PICC line at home, PT / OT. She continues to reside at home with boyfriend Alicia Reynolds who cares for her 24hrs/daily. Palliative Care was asked to help address goals of care.   CODE STATUS: Full code  PPS: 40%? By description from family  HOSPICE ELIGIBILITY/DIAGNOSIS: TBD  PAST MEDICAL HISTORY:  Past Medical History:  Diagnosis Date   Atrial fibrillation (HCC)    Brain bleed (HCC)    COPD (chronic obstructive pulmonary disease) (HCC)    Head trauma    Hypertension    Hypothyroidism     SOCIAL HX:  Social History   Tobacco Use   Smoking status: Current Every Day Smoker   Smokeless tobacco: Never Used  Substance Use Topics   Alcohol use: Not Currently    ALLERGIES:  Allergies  Allergen Reactions   Meperidine And Related    Penicillins      PERTINENT MEDICATIONS:  Outpatient Encounter Medications as of 06/02/2018  Medication Sig   bumetanide (BUMEX) 1 MG tablet Take 1 tablet by mouth every morning.   carvedilol (COREG) 12.5 MG tablet Take 1 tablet by mouth 2 (two) times daily.   digoxin (LANOXIN) 0.125 MG tablet Take 1 tablet by mouth every morning.   pantoprazole (PROTONIX) 40 MG tablet Take 1 tablet by mouth every morning.   potassium chloride SA (KLOR-CON M20) 20 MEQ tablet Take 1 tablet by mouth 2 (two) times daily.   tamsulosin (FLOMAX) 0.4 MG CAPS capsule Take 1 capsule by mouth daily after breakfast.   vitamin B-12 (CYANOCOBALAMIN)  1000 MCG tablet Take 1 tablet by mouth daily.   warfarin (COUMADIN) 4 MG tablet Take 4MG  by mouth every Tuesday and Friday evening and take 2MG  by mouth all other evenings   No facility-administered encounter medications on file as of 06/02/2018.     PHYSICAL EXAM:  Deferred telephone visit Keylie Beavers Prince Rome, NP

## 2018-06-03 ENCOUNTER — Other Ambulatory Visit: Payer: Self-pay

## 2018-06-26 ENCOUNTER — Other Ambulatory Visit: Payer: Self-pay

## 2018-06-26 ENCOUNTER — Encounter: Payer: Self-pay | Admitting: Nurse Practitioner

## 2018-06-26 ENCOUNTER — Other Ambulatory Visit: Payer: Medicare PPO | Admitting: Nurse Practitioner

## 2018-06-26 DIAGNOSIS — R630 Anorexia: Secondary | ICD-10-CM | POA: Insufficient documentation

## 2018-06-26 DIAGNOSIS — Z515 Encounter for palliative care: Secondary | ICD-10-CM

## 2018-06-26 NOTE — Progress Notes (Signed)
Therapist, [facility] Consult Note Telephone: [phone]  Fax: [phone]  PATIENT NAME: Alicia ShoresCarol G Hagmann DOB: 1945-03-10 MRN: [id number]  PRIMARY CARE PROVIDER:   Jerl MinaHedrick, James, MD  REFERRING PROVIDER:  Jerl MinaHedrick, James, MD [address] [facility] [place], [place] [address]  RESPONSIBLE PARTY:   Percell BostonDouglas Menkle patients boyfriend/24hr caregiver [phone] Daughter Donna ChristenKim Snlenik [phone]  Due to the COVID-19 crisis, this visit was done via telemedicine/telephone from my office as video equipment note available per Ms. Alicia Reynolds and it was initiated and consent by this patient and or family.  RECOMMENDATIONS and PLAN:  1. Palliative care encounter Z51.5; Palliative medicine team will continue to support patient, patient's family, and medical team. Visit consisted of counseling and education dealing with the complex and emotionally intense issues of symptom management and palliative care in the setting of serious and potentially life-threatening illness  2. Anorexia R63.0 but appetite remaining declined. Continue to encourage supplements and comfort feedings. Discuss nutrition  ASSESSMENT:     I called [name]. Explain purpose for palliative care telephone visit as video is not available. We talked about how she was feeling today. She verbalize that she is doing okay. We talked about her functional level since therapy is stop. She said that she does continue to do the exercises that her therapist had given her. We talked about symptoms of pain and shortness of breath what she denies. We talked about her appetite and she shared that she is eating fairly well. We talked about foods that she does eat. We talked about her upcoming scheduled surgery for 6 / 2020 for cranioplasty for skull defect. [name] endorses that she plans on continuing with that appointment and surgery. We talked about role of palliative care and plan of care. Alicia Reynolds was not available  at this time for further discussion. Therapeutic listening and emotional support provided. I discuss with Ms. Alicia Reynolds that I will recontact Alicia Reynolds, her caregiver for further discussion of medical goals. [name] in agreement. I called and message left. I have attempted to contact daughter, Alicia Reynolds  I spent 35 minutes providing this consultation,  from 1:00pm to 1:35pm. More than 50% of the time in this consultation was spent coordinating communication.   HISTORY OF PRESENT ILLNESS:  Alicia ShoresCarol G Reynolds is a 73 y.o. year old female with multiple medical problems including COPD, atrial fibrillation, ICD, diastolic heart failure, mitral valve prolapse, MI 2006, traumatic fall 2 / 14 / 2019 with life-threatening subdural hematoma s/p post craniotomy s/p necrotic wound infection s/p scalp wound revision, advancement, placement of Integra dermal graph s/p debridement of scalp soft tissue defect, hypertension, hypothyroidism, gerd, pacemaker insertion, severe protein calorie malnutrition, right forearm fracture s/p orif. Seen in emergency department partment 8 / 26 / 2019 for abdominal pain around site of gastrostomy tube as well as exudate coming from non-healing wounds of her scalp. Her documented assessment 12 centimeter open surgical wound filled with exudate running from the occipital parietal region and a dorsal to ventricle direction toward his forehead. Additional wound to the occipital parietal region also oriented in dorsal ventral orientation that is filled with exudate that is approximately 3 cm in length. [facility]. Dr Shaune PollackFecci of [facility] neurosurgery accepted the patient onto his service. Her documentation patients delayed wound closure related in part two patients malnourished status. Peg tube placed in order to improve nutritional status and help wound healing and hopes of performing a revision  surgery which the patient had refused. Transferred to [facility].Her documentation she was  very weak essentially wheelchair dependent due to inability to ambulate secondary to fatigue. No appetite nor desire for food. 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 Palliative consult 9 / 6 / 2019 at that time she was seeking aggressive interventions, IV antibiotics through a PICC line at home, PT / OT. She continues to reside at home with boyfriend Percell Boston who cares for her 24hrs/daily. Follow-up PC visit today for further discussion of goc, no further changes. She continues to remain on the couch mainly. Therapy and home health has been completed. Palliative Care was asked to help to continue to address goals of care.   CODE STATUS: full code  PPS: 40% HOSPICE ELIGIBILITY/DIAGNOSIS: TBD  PAST MEDICAL HISTORY:  Past Medical History:  Diagnosis Date  . Atrial fibrillation (HCC)   . Brain bleed (HCC)   . COPD (chronic obstructive pulmonary disease) (HCC)   . Head trauma   . Hypertension   . Hypothyroidism     SOCIAL HX:  Social History   Tobacco Use  . Smoking status: Current Every Day Smoker  . Smokeless tobacco: Never Used  Substance Use Topics  . Alcohol use: Not Currently    ALLERGIES:  Allergies  Allergen Reactions  . Meperidine And Related   . Penicillins      PERTINENT MEDICATIONS:  Outpatient Encounter Medications as of 06/26/2018  Medication Sig  . bumetanide (BUMEX) 1 MG tablet Take 1 tablet by mouth every morning.  . carvedilol (COREG) 12.5 MG tablet Take 1 tablet by mouth 2 (two) times daily.  . digoxin (LANOXIN) 0.125 MG tablet Take 1 tablet by mouth every morning.  . pantoprazole (PROTONIX) 40 MG tablet Take 1 tablet by mouth every morning.  . potassium chloride SA (KLOR-CON M20) 20 MEQ tablet Take 1 tablet by mouth 2 (two) times daily.  . tamsulosin (FLOMAX) 0.4 MG CAPS capsule Take 1 capsule by mouth daily after breakfast.  . vitamin B-12 (CYANOCOBALAMIN) 1000 MCG tablet Take 1 tablet by mouth daily.  Marland Kitchen warfarin (COUMADIN) 4 MG tablet Take  4MG  by mouth every Tuesday and Friday evening and take 2MG  by mouth all other evenings   No facility-administered encounter medications on file as of 06/26/2018.     PHYSICAL EXAM:  Deferred  Norva Bowe Z Tanette Chauca, NP

## 2018-07-08 ENCOUNTER — Telehealth: Payer: Self-pay | Admitting: Nurse Practitioner

## 2018-07-08 NOTE — Telephone Encounter (Signed)
I called Alicia Reynolds, Alicia Reynolds's daughter for schedule telephone call. She currently [place] out of [place] and is Alicia Reynolds. We talked about the last time she was independent at home prior to the fall. We talked about past medical history and the study of chronic disease. We talked about catastrophic fall resulting in close to 8 months hospitalized. We talked about her current functional level being a couch life existence. We talked about her low motivation. We talked about traumatic brain injury. We talked about symptoms. We talked about appetite as she appears to have gained a small amount of weight at 100 pounds now. We talked about home health discharging and that she had to continue doing her own dressing changes. We talked about Alicia Reynolds moving her to [place] for one month prior to her surgery. We talked about the last time Alicia Reynolds did visit her which was over a year ago in [place]. Alicia Reynolds talked at length about Life review and Alicia Reynolds who passed away from lymphoma. Alicia Reynolds endorses that she and Alicia. Reynolds took care of him at home until he passed from pneumonia. We talked about report from home health nurse prior to discharge. We talked about couch to bed existence. Alicia Reynolds talked about Alicia Reynolds not acting that way when she visits as she makes her get up and do things. We talked about Alicia Reynolds relationship with Alicia Reynolds, her caregiver partner. We talked about her ability to care for herself. We talked about concerns with failure to thrive overall. We talked about traumatic brain injury with cognitive possible impairments and questionable decision-making. We talked about medical goals of care including aggressive versus conservative vs comfort care. She does have surgery scheduled for June to repair the flat-wound that is not closing. The plan is to move forward with that. Alicia Reynolds talked about wishes for aggressive interventions but not long-term on a ventilator. She talked about when Alicia Reynolds did have  her cardiac arrest and she was able to recover. We talked about code status has she does remain a full code. Alicia Reynolds endorses her wishes are to continue with full code status and aggressive interventions at this time. We talked about role of palliative care and plan of care. We talked about next step is for her to go to [place] for one month and return for her surgery. Palliative care to follow up after her surgery for further discussion of goals of care and support. Alicia Reynolds in agreement. Appointment is set for June after surgery for follow-up. Therapeutic listening and emotional support provided. Contact information provided. Questions answered to satisfaction.  Total time spent 50 minutes  Documentation 10 minutes  Phone discussion 40 minutes

## 2018-08-21 ENCOUNTER — Other Ambulatory Visit: Payer: Self-pay

## 2018-08-21 ENCOUNTER — Encounter: Payer: Self-pay | Admitting: Nurse Practitioner

## 2018-08-21 ENCOUNTER — Other Ambulatory Visit: Payer: Medicare PPO | Admitting: Nurse Practitioner

## 2018-08-21 DIAGNOSIS — R630 Anorexia: Secondary | ICD-10-CM

## 2018-08-21 DIAGNOSIS — Z515 Encounter for palliative care: Secondary | ICD-10-CM

## 2018-08-21 NOTE — Progress Notes (Signed)
Therapist, [facility] Consult Note Telephone: [phone]  Fax: [phone]  PATIENT NAME: Alicia ShoresCarol G Reynolds DOB: 19-Aug-1945 MRN: [id number]  PRIMARY CARE PROVIDER:   Jerl MinaHedrick, James, MD  REFERRING PROVIDER:  Jerl MinaHedrick, James, MD [address] [facility] [place],  [place] [id number]  RESPONSIBLE PARTY:   DouglasMenklepatients boyfriend/24hr [phone] Daughter Alicia Reynolds [phone]  Due to the COVID-19 crisis, this visit was done via telemedicine/telephonefrom my office as video equipment note available per Alicia. Alicia Reynolds and it was initiated and consent by this patient and or family.  RECOMMENDATIONS and PLAN:  1.Palliative care encounter Z51.5; Palliative medicine team will continue to support patient, patient's family, and medical team. Visit consisted of counseling and education dealing with the complex and emotionally intense issues of symptom management and palliative care in the setting of serious and potentially life-threatening illness  2. Anorexia R63.0 but appetite remaining declined. Continue to encourage supplements and comfort feedings. Discuss nutrition  ASSESSMENT:     8[facility] 16 / [facility] for skull defect greater than 5 cm.  Follow up palliative care visit for Alicia Reynolds. She took a trip to visit her daughter, Alicia Reynolds who is a Engineer, civil (consulting)nurse in [place] for over a month. I called Alicia Reynolds, Alicia Reynolds daughter for palliative care visit. Alicia endorses that Alicia Reynolds did come to [place] for her trip. Alicia endorses Alicia Reynolds has not returned back to [place] her home with her significant Reynolds Alicia Reynolds. Alicia endorses she was able to get the head wound cleaned with saline and dry dressings. Alicia Reynolds endorses that she was upset that the wound had become so uncapped. We talked Alicia Reynolds course with Home Health in that she had been discharged a few weeks prior to Alicia Reynolds bring [place]ia [place] to [place]. Alicia endorses Alicia Reynolds  does need to be made to do things. She shared that if she is not made to do things she will do nothing for herself. We talked about the wound. We talked about [facility] to include vascular. We talked about Alicia's involvement and she shared that due to work she is not able to return during the time of the surgery. We talked about her appetite and she is drinking liquids. We talked about her tube feedings and supplements for giving her diarrhea. Alicia endorses that she just gave her ensure which seemed to work better without diarrhea. We talked about home situation. We talked about appointments Dr Burnett ShengHedrick yesterday. Dr Burnett ShengHedrick was going to proceed in order Home Health. We talked about level of motivation. We talked about realistic expectations. Discuss with Alicia Reynolds follow-up appointment for a palliative care. Discuss with Alicia Reynolds will follow up with Alicia Reynolds and call Alicia BostonDouglas Reynolds also for update.  I called Alicia Reynolds, Alicia Reynolds. We talked about purpose for palliative care visit. We talked about how much Alicia. Alicia Reynolds is doing since she is been back. Alicia Reynolds endorses she is not been doing well. She won't get up or do anything for herself. She won't take a bath or clean her wound. We talked about appointment yesterday with Dr Burnett ShengHedrick. We talked about expectations. We talked about upcoming surgery. We talked about Alicia. Littles trip to [place]. We talked about currently asymptomatic. We talked about appetite and feedings. We talked about home health being ordered. Alicia Reynolds endorses that he feels like he no longer can care for her at home. He feels like everything he does is  not right. He feels like this is very stressful and he is experiencing Reynolds fatigue. Coping strategies discussed. Asked if it was okay to contact palliative care social worker and Alicia Reynolds in agreement. We talked about role of palliative care and plan of care. Follow-up palliative care visit for in-home set for July  7th Tuesday at 2. Therapeutic listening and emotional support provided. Contact information provided. Questions answered to satisfaction.  I called Alicia Reynolds at her home. We talked about purpose of palliative care visit follow up. Alicia Reynolds in agreement. We talked about how she was feeling today. She sure that she is doing okay. We talked about her trip to [place]. We talked about symptoms of pain and shortness of breath but she denies. We talked about her wound and she shares that she "can't take care of something that is on the back of her head". We talked about her appointment with Dr Burnett ShengHedrick yesterday and Home Health currently being ordered. We talked about motivation level. We talked about appetite and what she is been drinking. We talked about her tube feedings. We talked about medical goals and her wishes are to continue treating with his treatable. She is looking forward to her surgery in August. We talked about concerns for difficulty for care at home. We talked about social worker palliative care consult. We talked about discussion with Alicia Reynolds her daughter and Alicia Reynolds. We talked about role of palliative care and plan of care. Discussed follow-up appointment in Alicia Reynolds in agreement. Therapeutic listening and emotional support provided. Questions answered to satisfaction. Contact information provided.  I spent 60 minutes providing this consultation,  from 12:00pm to 1:00pm. More than 50% of the time in this consultation was spent coordinating communication.   HISTORY OF PRESENT ILLNESS:  Alicia ShoresCarol G Reynolds is a 73 y.o. year old female with multiple medical problems including . COPD, atrial fibrillation, ICD, diastolic heart failure, mitral valve prolapse, MI 2006, traumatic fall2 / 14 / 2019 with life-threatening subdural hematoma s/p post craniotomy s/p necrotic wound infection s/p scalp wound revision, advancement, placement of Integra dermal graph s/p debridement of scalp soft tissue defect,  hypertension, hypothyroidism, gerd, pacemaker insertion, severe protein calorie malnutrition, right forearm fracture s/p orif. Seen in emergency department partment 8 / 26 / 2019 for abdominal pain around site of gastrostomy tube as well as exudate coming from non-healing wounds of her scalp. Her documented assessment 12 centimeter open surgical wound filled with exudate running from the occipital parietal region and a dorsal to ventricle direction toward his forehead. Additional wound to the occipital parietal region also oriented in dorsal ventral orientation that is filled with exudate that is approximately 3 cm in length. [facility]. Dr Shaune PollackFecci of [facility] neurosurgery accepted the patient onto his service. Her documentation patients delayed wound closure related in part two patients malnourished status. Peg tube placed in order to improve nutritional status and help wound healing and hopes of performing a revision surgery which the patient had refused. Transferred to [facility].Her documentation she was very weak essentially wheelchair dependent due to inability to ambulate secondary to fatigue. No appetite nor desire for food. 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 Palliative consult 9 / 6 / 2019 at that time she was seeking aggressive interventions, IV antibiotics Palliative Care was asked to help to continue to address goals of care.   CODE STATUS: full code  PPS: 40% HOSPICE ELIGIBILITY/DIAGNOSIS: TBD  PAST MEDICAL HISTORY:  Past  Medical History:  Diagnosis Date   Atrial fibrillation ([place])    Brain bleed (HCC)    COPD (chronic obstructive pulmonary disease) ([place])    Head trauma    Hypertension    Hypothyroidism     SOCIAL HX:  Social History   Tobacco Use   Smoking status: Current Every Day Smoker   Smokeless tobacco: Never Used  Substance Use Topics   Alcohol use: Not Currently    ALLERGIES:  Allergies  Allergen Reactions    Meperidine And Related    Penicillins      PERTINENT MEDICATIONS:  Outpatient Encounter Medications as of 08/21/2018  Medication Sig   bumetanide (BUMEX) 1 MG tablet Take 1 tablet by mouth every morning.   carvedilol (COREG) 12.5 MG tablet Take 1 tablet by mouth 2 (two) times daily.   digoxin (LANOXIN) 0.125 MG tablet Take 1 tablet by mouth every morning.   pantoprazole (PROTONIX) 40 MG tablet Take 1 tablet by mouth every morning.   potassium chloride SA (KLOR-CON M20) 20 MEQ tablet Take 1 tablet by mouth 2 (two) times daily.   tamsulosin (FLOMAX) 0.4 MG CAPS capsule Take 1 capsule by mouth daily after breakfast.   vitamin B-12 (CYANOCOBALAMIN) 1000 MCG tablet Take 1 tablet by mouth daily.   warfarin (COUMADIN) 4 MG tablet Take 4MG  by mouth every Tuesday and Friday evening and take 2MG  by mouth all Reynolds evenings   No facility-administered encounter medications on file as of 08/21/2018.     PHYSICAL EXAM:  Deferred  Arda Keadle Z Keona Bilyeu, NP

## 2018-08-22 ENCOUNTER — Telehealth: Payer: Self-pay

## 2018-08-22 NOTE — Telephone Encounter (Signed)
SW received referral from NP to follow-up with patient/patient's significant other regarding complex care needs. SW attempted twice to contact [place] (significant other). SW left VM with contact information.

## 2018-08-29 ENCOUNTER — Telehealth: Payer: Self-pay

## 2018-08-29 NOTE — Telephone Encounter (Signed)
SW contacted Nathaneil Canary (significant other) to discuss complex care needs and concerns. SW offered to visit with NP on Tuesday, 09-02-2018 at [facility] and [place] agreed. Discussion regarding care to continue.

## 2018-09-02 ENCOUNTER — Other Ambulatory Visit: Payer: Self-pay

## 2018-09-02 ENCOUNTER — Other Ambulatory Visit: Payer: Medicare PPO

## 2018-09-05 ENCOUNTER — Other Ambulatory Visit: Payer: Self-pay

## 2018-09-05 ENCOUNTER — Other Ambulatory Visit: Payer: Medicare PPO | Admitting: Nurse Practitioner

## 2018-09-05 ENCOUNTER — Other Ambulatory Visit: Payer: Medicare PPO

## 2018-09-05 ENCOUNTER — Encounter: Payer: Self-pay | Admitting: Nurse Practitioner

## 2018-09-05 DIAGNOSIS — R531 Weakness: Secondary | ICD-10-CM | POA: Insufficient documentation

## 2018-09-05 DIAGNOSIS — Z515 Encounter for palliative care: Secondary | ICD-10-CM

## 2018-09-05 DIAGNOSIS — R630 Anorexia: Secondary | ICD-10-CM

## 2018-09-05 NOTE — Progress Notes (Signed)
COMMUNITY PALLIATIVE CARE SW NOTE  PATIENT NAME: Alicia Reynolds DOB: [birth year] MRN: [id number]  PRIMARY CARE PROVIDER: Maryland Pink, MD  RESPONSIBLE PARTY:  Acct ID - Guarantor Home Phone Work Phone Relationship Acct Type  [id number] Maud Deed [phone]  Self P/F     [place], [place], [place] [address]     PLAN OF CARE and INTERVENTIONS:             1. GOALS OF CARE/ ADVANCE CARE PLANNING:  Patient is a FULL CODE. MOST form completed. Goals are to "get better". Long-term care goals to continue to be discussed. 2. SOCIAL/EMOTIONAL/SPIRITUAL ASSESSMENT/ INTERVENTIONS:  SW and NP met with patient and Alicia Reynolds (patient's significant other) in the home. Discussed patient's current care. Patient denies pain. Patient appeared tired, overwhelmed. Discussed patient's upcoming surgery scheduled for August 10th. Patient seems to have concerns for the purpose of the surgery. NP discussed with patient. Team encouraged patient to also continue conversation with surgeon. Patient's daughter. Alicia Reynolds is [place] and patient shared that she does better with her daughter's care but is unsure of the long-term plan. Patient seems open to moving to live with daughter but does not want to leave [place]. Discussion to continue following surgery. SW attempted to follow-up with Alicia Reynolds, left VM and contact information.  3. PATIENT/CAREGIVER EDUCATION/ COPING:  During visit, patient and Alicia Reynolds expressed frustrations with the medical system and desire for answers about patient's condition. Alicia Reynolds also expressed caregiver fatigue and verbalized the challenges of  caring for patient. Team to continue providing ongoing emotional support and encourage family discussion.  4. PERSONAL EMERGENCY PLAN:  Patient and family will call 9-1-1 for emergencies.  5. COMMUNITY RESOURCES COORDINATION/ HEALTH CARE NAVIGATION:  Patient is receiving home health nursing for wound care and PT with [facility]. Patient is interested in  adding an aide. SW called to request aide from [facility] and spoke with case manager, Alicia Reynolds. Alicia Reynolds reports that insurance would allow for them to send an aide for twice a week for two weeks. Patient's Medicaid is only family planning, SW confirmed and will notify patient. 6. FINANCIAL/LEGAL CONCERNS/INTERVENTIONS:  None.     SOCIAL HX:  Social History   Tobacco Use  . Smoking status: Current Every Day Smoker  . Smokeless tobacco: Never Used  Substance Use Topics  . Alcohol use: Not Currently    CODE STATUS:   Code Status: Prior  ADVANCED DIRECTIVES: N MOST FORM COMPLETE:  Yes. HOSPICE EDUCATION PROVIDED: None.  PPS: Patient is dependent of most ADLs, patient uses walker to ambulate.  I spent 90 minutes with patient/family, from 10:00-11:30a providing education, support and consultation.    Alicia Lomb[place], LCSW

## 2018-09-05 NOTE — Progress Notes (Signed)
Therapist, [facility] Consult Note Telephone: [phone]  Fax: [phone]  PATIENT NAME: Alicia ShoresCarol G Mccort DOB: 11-07-1945 MRN: [id number]  PRIMARY CARE PROVIDER:   Jerl MinaHedrick, James, MD  REFERRING PROVIDER:  Jerl MinaHedrick, James, MD [address] [facility] [place] [id number]  RESPONSIBLE PARTY:  Self;   DouglasMenkle boyfriend/24hr [phone] Daughter Donna ChristenKim Snlenik [phone]  RECOMMENDATIONS and PLAN: 1.Palliative care encounter Z51.5; Palliative medicine team will continue to support patient, patient's family, and medical team. Visit consisted of counseling and education dealing with the complex and emotionally intense issues of symptom management and palliative care in the setting of serious and potentially life-threatening illness  2. Generalized weakness R53.1  secondary to continue with therapy as able. Encourage energy conservation and rest times.  3.Anorexia R63.0 but appetite remaining declined. Continue to encourage supplements and comfort feedings. Discuss nutrition  ASSESSMENT:     Alicia Reynolds social worker and I discussed purpose for palliative care visit and [name] in agreement. [name] did walk out of the room but later did join the discussion. We talked about how much fun was feeling today and she verbalize that she was frustrated with not knowing information. She sure that she has an upcoming surgery but doesn't know if she really wants to go through with it or not. We talked about her previous surgery and time that she did spend in the hospital. We talked about symptoms, appetite has she is feeding herself. We talked about the extensive wound care required to her head. She shared that she refuses to do wound care and [name] is not able to do it either. Currently [facility] is coming in to perform the wound care, therapy. She shared that the therapy does come multiple times a week in addition to  nursing. She shared that nursing does come in and do the dressing change. She currently does not have cna service. She expressed she need someone to come in the house to give her a bath, clean and cook for them. We talked about the challenges involved in her life being different than she thought it would have been when she moved to [place] with Alicia Reynolds. We talked about her care requirements. She shared that she feels like she is not getting the care that she needs. We talked about the time that she spend in [place]. She shared that in [place] wound care was being done multiple times a day to her head where I was she's not able to do that now. We talked about pros and cons of residing in [place] where she is or with her daughter in [place]. Discuss that Alicia Reynolds her daughter is not able to come for her surgery in August [facility] but [name] will be her caregiver and take her to the hospital, recover her when she is discharged. When [place] return to the discussion he was very frustrated about attempting to give [name] care with Ms. Alicia Reynolds giving him refusal and attitude. [name] endorses he will not force her to do something she doesn't want to do. He feels like she is giving him an attitude in addition to all decisions are made and he has no saying them. We talked about challenges with communication, frustration and caregiver fatigue. We talked about Alicia Reynolds being Ms [facility] decision-maker if she's not able to make decisions for herself. Ask [name] if he does communicate with Alicia Reynolds and he showed that he does not talk with  her. We talked about goals of care including most form which was completed as she wants to be a full code with aggressive interventions. She was an agreement to take a picture to be able to put in [place]. We talked about seeing a service that could possibly be available through Medicaid and Selinda Eon the Education officer, museum will follow up. Nathaneil Canary expressed  great frustration as in the caregiver role. We talked about role of palliative care, home health and therapy. We talked about the role at length of palliative care and in agreement to set up a follow-up phone appointment prior to her scheduled surgery the beginning of August. Ms Helf and Nathaneil Canary both in agreement. Contact information provided. Questions answered. Therapeutic listening and emotional support provided. Margaretmary Lombard social worker will follow up with Alicia Mercury, [name] daughter for further discussion on concerns Nathaneil Canary expressed with caregiver fatigue.   I spent 90 minutes providing this consultation,  from 10:00am to 11:30pm. More than 50% of the time in this consultation was spent coordinating communication.   HISTORY OF PRESENT ILLNESS:  Alicia Reynolds is a 73 y.o. year old female with multiple medical problems including COPD, atrial fibrillation, ICD, diastolic heart failure, mitral valve prolapse, MI 2006, traumatic fall2 / 14 / 2019 with life-threatening subdural hematoma s/p post craniotomy s/p necrotic wound infection s/p scalp wound revision, advancement, placement of Integra dermal graph s/p debridement of scalp soft tissue defect, hypertension, hypothyroidism, gerd, pacemaker insertion, severe protein calorie malnutrition, right forearm fracture s/p orif. Seen in emergency department partment 8 / 26 / 2019 for abdominal pain around site of gastrostomy tube as well as exudate coming from non-healing wounds of her scalp. Her documented assessment 12 centimeter open surgical wound filled with exudate running from the occipital parietal region and a dorsal to ventricle direction toward his forehead. Additional wound to the occipital parietal region also oriented in dorsal ventral orientation that is filled with exudate that is approximately 3 cm in length. Wound care with plastics at [facility] and Neurosurgery. Dr Brett Albino of [facility] accepted the patient onto his service. Her documentation  patients delayed wound closure related in part two patients malnourished status. Peg tube placed in order to improve nutritional status. Ms. Bosques continues to reside at home with her boyfriend Nathaneil Canary. Follow up palliative care visit schedule for in-person visit with nurse practitioner and social worker for discussions of goals of care. At present she is sitting on the couch in her home. She appears thin but comfortable. Nathaneil Canary is present her boyfriend/ caregiver. Palliative Care was asked to help to continue to address goals of care.   CODE STATUS: Full code  PPS: 40% HOSPICE ELIGIBILITY/DIAGNOSIS: TBD  PAST MEDICAL HISTORY:  Past Medical History:  Diagnosis Date  . Atrial fibrillation ([place])   . Brain bleed ([place])   . COPD (chronic obstructive pulmonary disease) ([place])   . Head trauma   . Hypertension   . Hypothyroidism     SOCIAL HX:  Social History   Tobacco Use  . Smoking status: Current Every Day Smoker  . Smokeless tobacco: Never Used  Substance Use Topics  . Alcohol use: Not Currently    ALLERGIES:  Allergies  Allergen Reactions  . Meperidine And Related   . Penicillins      PERTINENT MEDICATIONS:  Outpatient Encounter Medications as of 09/05/2018  Medication Sig  . bumetanide (BUMEX) 1 MG tablet Take 1 tablet by mouth every morning.  . carvedilol (COREG) 12.5 MG tablet Take  1 tablet by mouth 2 (two) times daily.  . digoxin (LANOXIN) 0.125 MG tablet Take 1 tablet by mouth every morning.  . pantoprazole (PROTONIX) 40 MG tablet Take 1 tablet by mouth every morning.  . potassium chloride SA (KLOR-CON M20) 20 MEQ tablet Take 1 tablet by mouth 2 (two) times daily.  . tamsulosin (FLOMAX) 0.4 MG CAPS capsule Take 1 capsule by mouth daily after breakfast.  . vitamin B-12 (CYANOCOBALAMIN) 1000 MCG tablet Take 1 tablet by mouth daily.  Marland Kitchen. warfarin (COUMADIN) 4 MG tablet Take 4MG  by mouth every Tuesday and Friday evening and take 2MG  by mouth all other evenings   No  facility-administered encounter medications on file as of 09/05/2018.     PHYSICAL EXAM:   General: NAD, frail appearing, thin female Cardiovascular: regular rate and rhythm Pulmonary: clear ant fields Abdomen: soft, nontender, + bowel sounds Extremities: +BLE edema, no joint deformities Skin: no rashes/wound to occipital; dressing dry/intact Neurological: Weakness but otherwise nonfocal/walks with rolling walker  Sanyiah Kanzler Z Shirley Bolle, NP

## 2018-09-08 ENCOUNTER — Telehealth: Payer: Self-pay

## 2018-09-08 NOTE — Telephone Encounter (Signed)
SW call to Alicia Reynolds (patient's daughter) to follow-up from visit on Friday with patient and Alicia Reynolds (patient's significant other).  Kim provided psychosocial history and medical history of patient. Kim discussed concerns with patient's care and upcoming surgery. SW summarized visit on Friday. Alicia Reynolds is open to supporting patient's wishes and wants the best for her care overall. Kim feels that patient does better with Alicia Reynolds because she pushes her to "get better and do more". SW used active and reflective listening. SW validated Kim's feelings and concerns, discussed the long-term plan for care of patient and provided emotional support. Kim plans to follow-up with patient and continue discussion of patient's care and wishes. SW provided contact information if additional needs or concerns arise.

## 2018-09-23 ENCOUNTER — Other Ambulatory Visit: Payer: Self-pay

## 2018-09-23 ENCOUNTER — Encounter: Payer: Self-pay | Admitting: *Deleted

## 2018-09-23 ENCOUNTER — Emergency Department
Admission: EM | Admit: 2018-09-23 | Discharge: 2018-09-23 | Disposition: A | Discharge status: Home / Self Care | Payer: Medicare PPO | Attending: Emergency Medicine | Admitting: Emergency Medicine

## 2018-09-23 DIAGNOSIS — J449 Chronic obstructive pulmonary disease, unspecified: Secondary | ICD-10-CM | POA: Insufficient documentation

## 2018-09-23 DIAGNOSIS — I10 Essential (primary) hypertension: Secondary | ICD-10-CM | POA: Diagnosis not present

## 2018-09-23 DIAGNOSIS — E039 Hypothyroidism, unspecified: Secondary | ICD-10-CM | POA: Insufficient documentation

## 2018-09-23 DIAGNOSIS — R799 Abnormal finding of blood chemistry, unspecified: Secondary | ICD-10-CM | POA: Diagnosis present

## 2018-09-23 DIAGNOSIS — F17200 Nicotine dependence, unspecified, uncomplicated: Secondary | ICD-10-CM | POA: Diagnosis not present

## 2018-09-23 DIAGNOSIS — Z79899 Other long term (current) drug therapy: Secondary | ICD-10-CM | POA: Diagnosis not present

## 2018-09-23 DIAGNOSIS — Z7901 Long term (current) use of anticoagulants: Secondary | ICD-10-CM | POA: Diagnosis not present

## 2018-09-23 DIAGNOSIS — Z950 Presence of cardiac pacemaker: Secondary | ICD-10-CM | POA: Diagnosis not present

## 2018-09-23 DIAGNOSIS — R791 Abnormal coagulation profile: Secondary | ICD-10-CM | POA: Diagnosis not present

## 2018-09-23 LAB — HEPATIC FUNCTION PANEL
ALT: 11 U/L (ref 0–44)
AST: 31 U/L (ref 15–41)
Albumin: 3.7 g/dL (ref 3.5–5.0)
Alkaline Phosphatase: 55 U/L (ref 38–126)
Bilirubin, Direct: 0.3 mg/dL — ABNORMAL HIGH (ref 0.0–0.2)
Indirect Bilirubin: 0.9 mg/dL (ref 0.3–0.9)
Total Bilirubin: 1.2 mg/dL (ref 0.3–1.2)
Total Protein: 8 g/dL (ref 6.5–8.1)

## 2018-09-23 LAB — PROTIME-INR
INR: 6 (ref 0.8–1.2)
Prothrombin Time: 52.5 seconds — ABNORMAL HIGH (ref 11.4–15.2)

## 2018-09-23 NOTE — ED Notes (Signed)
E-signature not working at this time. Pt verbalizes understanding of D/C instructions with no further questions at this time. Pt in NAD at time of D/C.

## 2018-09-23 NOTE — ED Provider Notes (Signed)
[facility] Emergency Department Provider Note  ____________________________________________   I have reviewed the triage vital signs and the nursing notes.   HISTORY  Chief Complaint Abnormal Lab   History limited by: Not Limited   HPI Alicia Reynolds is a 73 y.o. female who presents to the emergency department today because of elevated INR found on outpatient blood work.  They were called and told that it was 7.4.  They were instructed to come to the emergency department for vitamin K.  Patient denies any bleeding.  The patient currently had to hold multiple doses of warfarin this past week because of another elevated INR reading.   Records reviewed. Per medical record review patient did have INR reading of 7.4 today.  Per one telephone note they recommended holding warfarin for 1 week.  Another telephone note mentioned coming to the emergency department for further management and evaluation.  Past Medical History:  Diagnosis Date  . Atrial fibrillation ([place])   . Brain bleed ([place])   . COPD (chronic obstructive pulmonary disease) ([place])   . Head trauma   . Hypertension   . Hypothyroidism     Patient Active Problem List   Diagnosis Date Noted  . Weakness 09/05/2018  . Anorexia 06/26/2018  . Palliative care encounter 06/02/2018  . Acute encephalopathy 06/14/2017  . UTI (urinary tract infection) 06/14/2017  . HTN (hypertension) 06/14/2017  . Hypothyroidism 06/14/2017  . AF (paroxysmal atrial fibrillation) ([place]) 06/14/2017  . GERD (gastroesophageal reflux disease) 06/14/2017  . AKI (acute kidney injury) ([place]) 06/14/2017  . Protein-calorie malnutrition, severe 06/14/2017    Past Surgical History:  Procedure Laterality Date  . BRAIN SURGERY    . PACEMAKER INSERTION      Prior to Admission medications   Medication Sig Start Date End Date Taking? Authorizing Provider  bumetanide (BUMEX) 1 MG tablet Take 1 tablet by mouth every morning. 06/12/16    [provider]  carvedilol (COREG) 12.5 MG tablet Take 1 tablet by mouth 2 (two) times daily. 10/26/16   [provider]  digoxin (LANOXIN) 0.125 MG tablet Take 1 tablet by mouth every morning. 03/18/17   [provider]  pantoprazole (PROTONIX) 40 MG tablet Take 1 tablet by mouth every morning. 05/24/17   [provider]  potassium chloride SA (KLOR-CON M20) 20 MEQ tablet Take 1 tablet by mouth 2 (two) times daily. 06/12/16   [provider]  tamsulosin (FLOMAX) 0.4 MG CAPS capsule Take 1 capsule by mouth daily after breakfast. 05/24/17   [provider]  vitamin B-12 (CYANOCOBALAMIN) 1000 MCG tablet Take 1 tablet by mouth daily.    [provider]  warfarin (COUMADIN) 4 MG tablet Take 4MG  by mouth every Tuesday and Friday evening and take 2MG  by mouth all other evenings 03/11/17   [provider]    Allergies Meperidine and related and Penicillins  Family History  Problem Relation Age of Onset  . Breast cancer Mother   . Heart attack Father     Social History Social History   Tobacco Use  . Smoking status: Current Every Day Smoker  . Smokeless tobacco: Never Used  Substance Use Topics  . Alcohol use: Not Currently  . Drug use: Not Currently    Review of Systems Constitutional: No fever/chills Eyes: No visual changes. ENT: No sore throat. Cardiovascular: No bleeding. Respiratory: Denies shortness of breath. Gastrointestinal: No abdominal pain.  No nausea, no vomiting.  No diarrhea.   Genitourinary: Negative for dysuria. Musculoskeletal:  Negative for back pain. Skin: Negative for rash. Neurological: Negative for headaches, focal weakness or numbness.  ____________________________________________   PHYSICAL EXAM:  VITAL SIGNS: ED Triage Vitals  Enc Vitals Group     BP --      Pulse Rate 09/23/18 2133 70     Resp 09/23/18 2133 (!) 22     Temp 09/23/18 2133 98.3 F (36.8 C)     Temp Source 09/23/18  2133 Oral     SpO2 09/23/18 2138 94 %     Weight 09/23/18 2134 109 lb (49.4 kg)     Height 09/23/18 2134 5\' 6"  (1.676 m)     Head Circumference --      Peak Flow --      Pain Score 09/23/18 2134 0   Constitutional: Alert and oriented.  Eyes: Conjunctivae are normal.  ENT      Head: Normocephalic and atraumatic.      Nose: No congestion/rhinnorhea.      Mouth/Throat: Mucous membranes are moist.      Neck: No stridor. Hematological/Lymphatic/Immunilogical: No cervical lymphadenopathy. Cardiovascular: Normal rate, regular rhythm.  No murmurs, rubs, or gallops.  Respiratory: Normal respiratory effort without tachypnea nor retractions. Breath sounds are clear and equal bilaterally. No wheezes/rales/rhonchi. Gastrointestinal: Soft and non tender. No rebound. No guarding.  Genitourinary: Deferred Musculoskeletal: Normal range of motion in all extremities.  Neurologic:  Normal speech and language. No gross focal neurologic deficits are appreciated.  Skin:  Skin is warm, dry and intact. No rash noted. Psychiatric: Mood and affect are normal. Speech and behavior are normal. Patient exhibits appropriate insight and judgment.  ____________________________________________    LABS (pertinent positives/negatives)  Hepatic function panel wnl except direct bili INR 6.0 ____________________________________________   EKG  None  ____________________________________________    RADIOLOGY  None  ____________________________________________   PROCEDURES  Procedures  ____________________________________________   INITIAL IMPRESSION / ASSESSMENT AND PLAN / ED COURSE  Pertinent labs & imaging results that were available during my care of the patient were reviewed by me and considered in my medical decision making (see chart for details).   Patient presented to the emergency department after outpatient lab showed INR of 7.4. Patient denies any bleeding or trauma. Repeat INR 6.0. Liver  function test without concerning findings. Discussed with patient hold warfarin and to follow up with cardiology office. Discussed return precautions for bleeding or trauma.   ____________________________________________   FINAL CLINICAL IMPRESSION(S) / ED DIAGNOSES  Final diagnoses:  Elevated INR     Note: This dictation was prepared with Dragon dictation. Any transcriptional errors that result from this process are unintentional     Phineas SemenGoodman, Dallyn Bergland, MD 09/23/18 [phone]

## 2018-09-23 NOTE — ED Triage Notes (Signed)
Pt to ED reporting she takes Warfarin and was told today that her "blood was too thick." No there medical complaints at this time. Pt has a noted increase WOB in triage but insists this is her normal WOB. Pt on 2L chronically. Pt also has both legs wrapped and reports this is due to increased swelling recently.

## 2018-09-23 NOTE — ED Notes (Signed)
Pt states INR level was 7.0 at home and primary care doctor sent her here to get a vitamin K shot.

## 2018-09-23 NOTE — Discharge Instructions (Addendum)
As we discussed please hold your warfarin. Please contact Dr. Saralyn Pilar office to schedule a recheck. As discussed please return for any bleeding or trauma.

## 2018-09-30 ENCOUNTER — Telehealth: Payer: Self-pay | Admitting: Nurse Practitioner

## 2018-09-30 NOTE — Telephone Encounter (Signed)
I called Ms. Tesfaye residence for follow-up palliative care visit and Nathaneil Canary, boyfriend endorsees Alicia Reynolds will be moving to [place] today to live with her daughter, He verbalized that there will be no surgery. Discussed will d/c from [facility]

## 2019-03-30 DEATH — deceased

## 2019-05-17 IMAGING — CT CT ABD-PELV W/ CM
2 of 5 series · 16 of 46 positions shown, 18 images · IV contrast (APPLIED)
Comparison: None.

CLINICAL DATA: Pain at the site of a gastrostomy tube since the
tube was placed 1 month ago.

EXAM:
CT ABDOMEN AND PELVIS WITH CONTRAST
TECHNIQUE: Multidetector CT imaging of the abdomen and pelvis was performed
using the standard protocol following bolus administration of
intravenous contrast.
CONTRAST:  75mL OMNIPAQUE IOHEXOL 300 MG/ML  SOLN

[Series 5: coronal st · coronal · 0.71mm/px · 3 of 75 slices shown]
[im 25/75  soft-tissue]
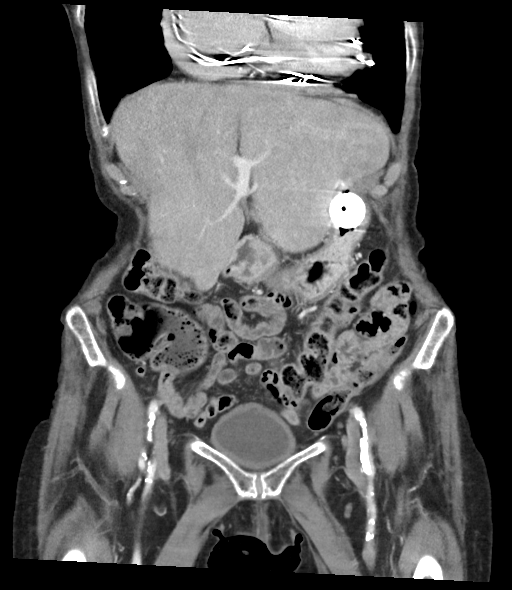
[im 33/75  soft-tissue]
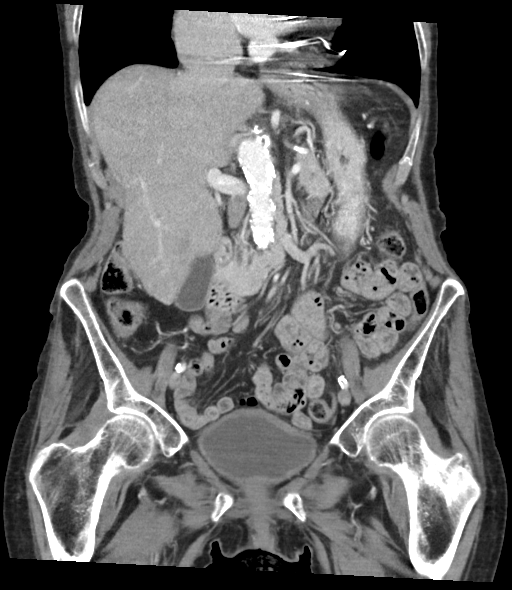
[im 42/75  soft-tissue]
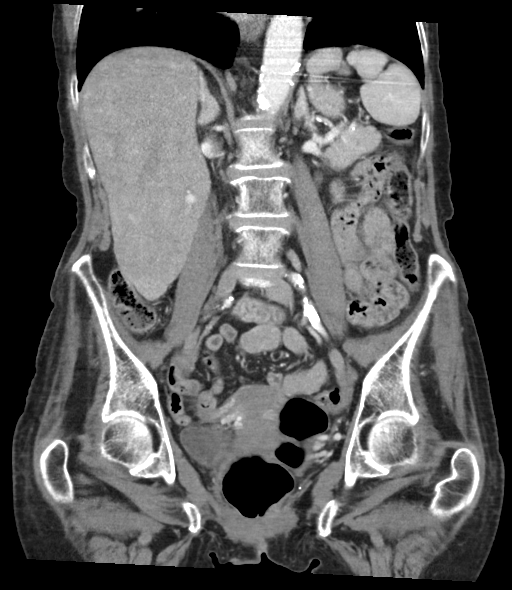

[Series 8: routine abd/pel with 2 · axial · 0.65mm/px · z∈[-439,-94]mm · 13 of 79 slices shown, 15 images]
[im 5/79  soft-tissue]
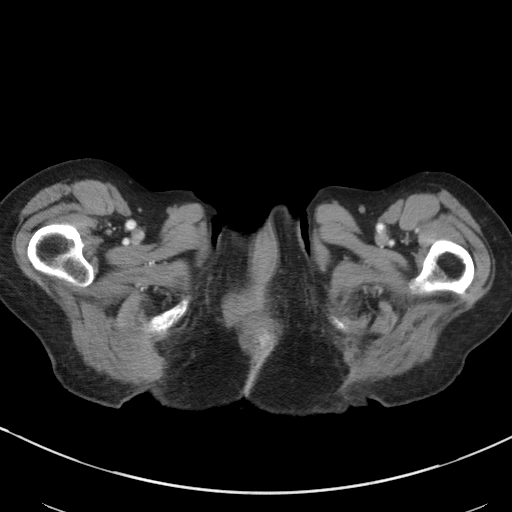
[im 5/79  bone]
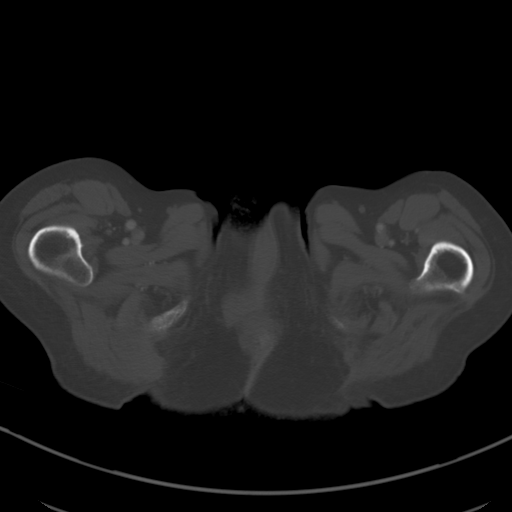
[im 10/79  soft-tissue]
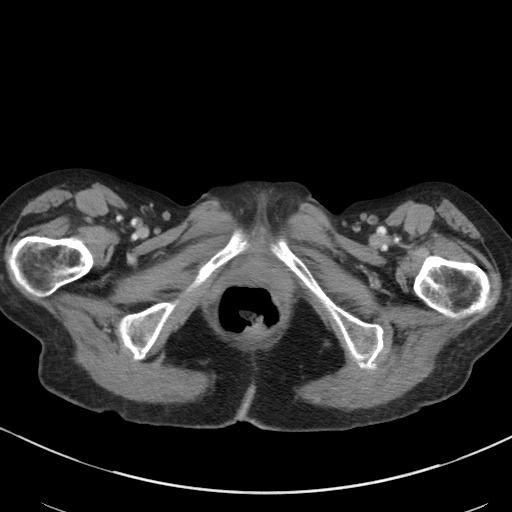
[im 19/79  soft-tissue]
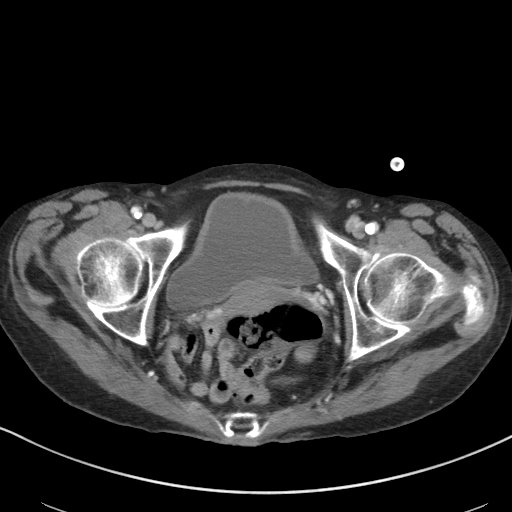
[im 23/79  soft-tissue]
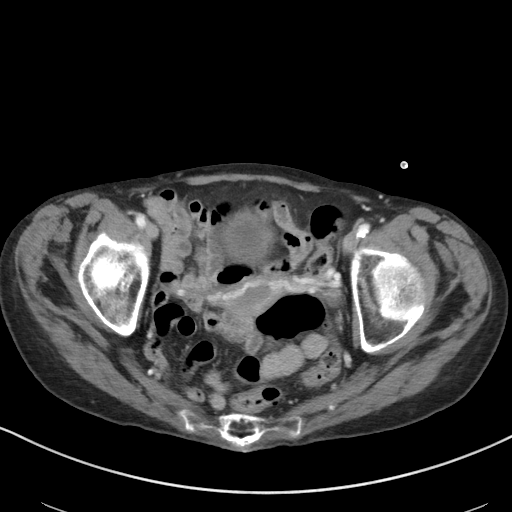
[im 28/79  soft-tissue]
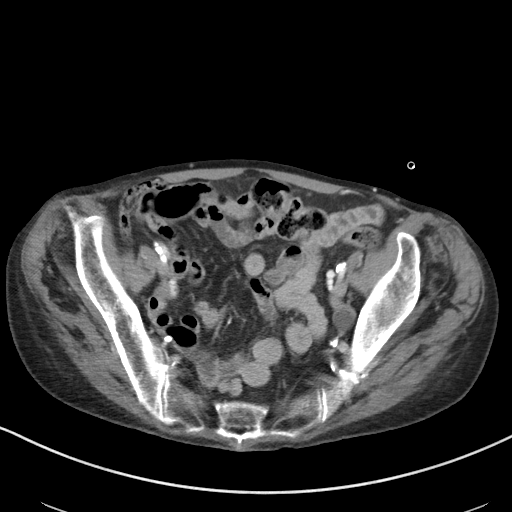
[im 33/79  soft-tissue]
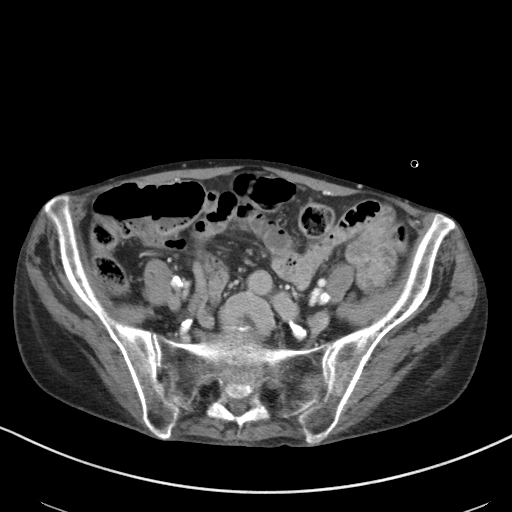
[im 42/79  soft-tissue]
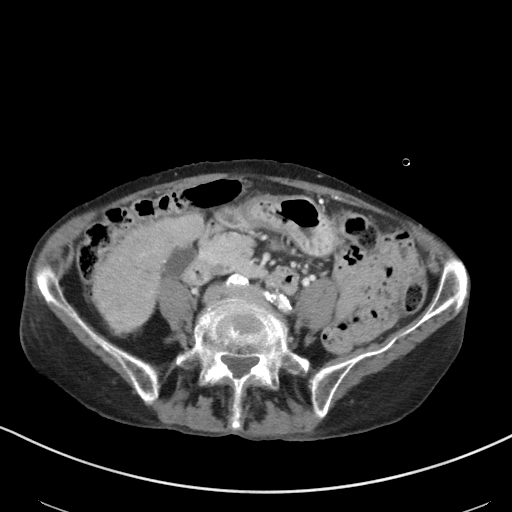
[im 46/79  soft-tissue]
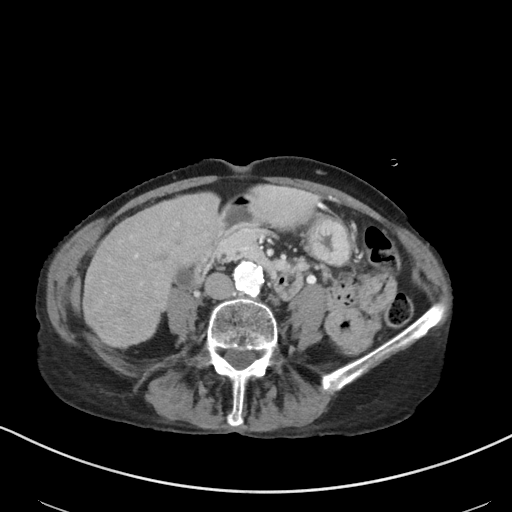
[im 51/79  soft-tissue]
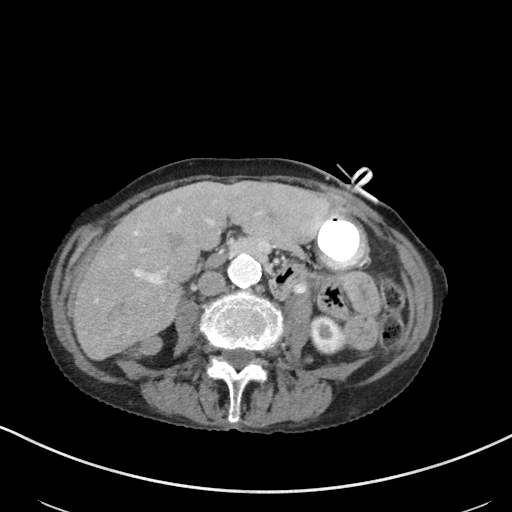
[im 51/79  bone]
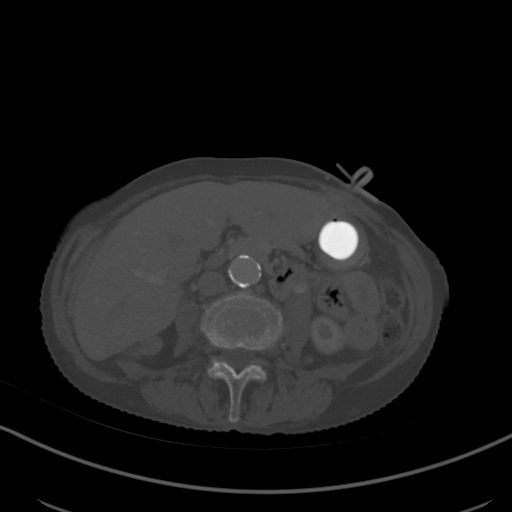
[im 56/79  soft-tissue]
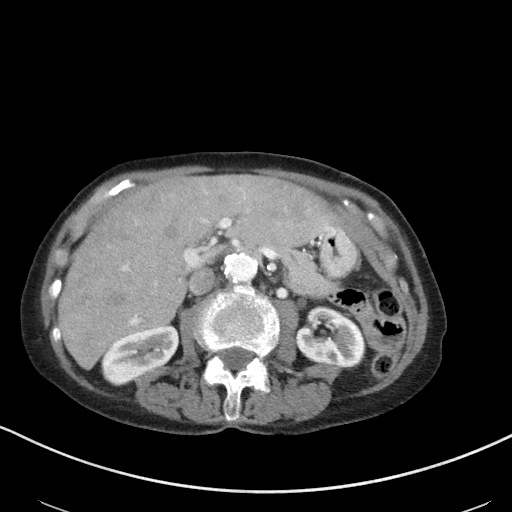
[im 60/79  soft-tissue]
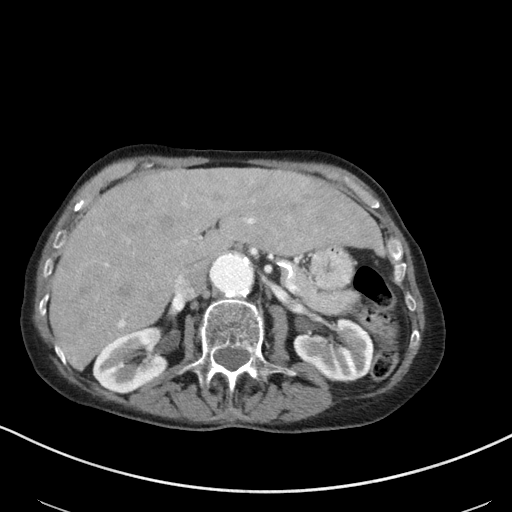
[im 69/79  soft-tissue]
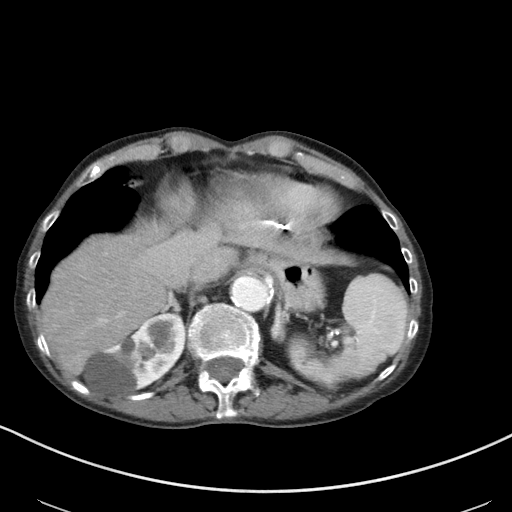
[im 74/79  soft-tissue]
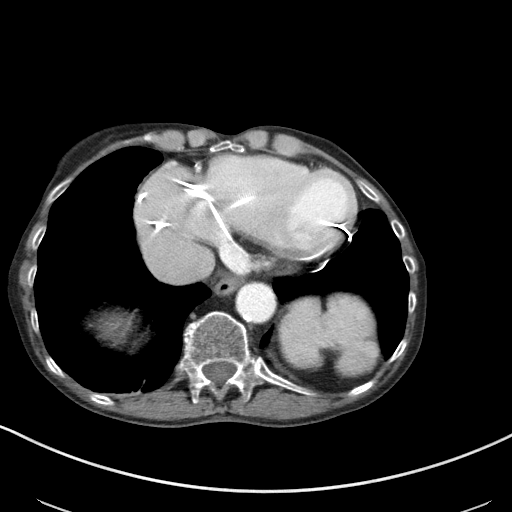

[16 of 46 positions shown; findings below may reference images not displayed]

FINDINGS: Lower chest: Enlarged heart. Intracardiac pacer and AICD leads.
Prosthetic mitral valve. Minimal right basilar linear atelectasis or
scarring.

Hepatobiliary: Heterogeneous liver containing a small cyst. The
liver contours are mildly lobulated with a prominent lateral segment
left lobe and caudate lobe. Normal appearing gallbladder.

Pancreas: Unremarkable. No pancreatic ductal dilatation or
surrounding inflammatory changes.

Spleen: Lobulated. Normal in size.

Adrenals/Urinary Tract: Normal appearing adrenal glands. Multiple
bilateral renal cysts, greater on the right. Minimal diffuse bladder
wall thickening. Unremarkable ureters.

Stomach/Bowel: Gastrostomy tube balloon and tip in the mid stomach.
No abnormality seen at the entry site. Mild sigmoid colon
diverticulosis. Unremarkable small bowel. No evidence of
appendicitis.

Vascular/Lymphatic: Atheromatous arterial calcifications without
aneurysm. No enlarged lymph nodes.

Reproductive: Uterus and right adnexa are unremarkable. There is a
1.7 cm simple appearing left ovarian cyst.

Other: Tiny umbilical hernia containing fat.

Musculoskeletal: Old, healed right inferior pubic ramus fracture.
Approximately 20% old L5 superior endplate compression fracture or
Schmorl's node formation. No bony retropulsion or acute fracture
lines. Minimal lumbar and lower thoracic spine degenerative changes.
IMPRESSION: 1. No acute abnormality.
2. Heterogeneous liver with enlarged lateral segment left lobe and
caudate lobe. This appearance can be seen with cirrhosis.
3. Minimal diffuse bladder wall thickening. This can be seen with
cystitis and bladder outlet obstruction.
4. Mild sigmoid diverticulosis.
5. 1.7 cm simple appearing left ovarian cyst. This is almost
certainly benign, but follow up ultrasound is recommended in 1 year
according to the Society of Radiologists in Ultrasound 5323
Consensus Conference Statement \(Lius Zeigler ET al. Management of
asymptomatic ovarian and other adnexal cysts imaged at us: society
of radiologists in ultrasound consensus conference statement.
Radiology [DATE]): [id number].).
